# Patient Record
Sex: Female | Born: 1965 | Race: White | Hispanic: No | Marital: Married | Smoking: Never smoker
Health system: Southern US, Community
[De-identification: ages and names within clinical notes are randomized; demographics above are authoritative.]

## PROBLEM LIST (undated history)

## (undated) DIAGNOSIS — K5792 Diverticulitis of intestine, part unspecified, without perforation or abscess without bleeding: Secondary | ICD-10-CM

## (undated) DIAGNOSIS — G43909 Migraine, unspecified, not intractable, without status migrainosus: Secondary | ICD-10-CM

## (undated) HISTORY — PX: APPENDECTOMY: SHX54

## (undated) HISTORY — PX: BACK SURGERY: SHX140

## (undated) HISTORY — PX: LAPAROSCOPIC REVISION OF GASTRIC BAND: SHX5921

## (undated) HISTORY — PX: LAPAROSCOPIC GASTRIC BANDING: SHX1100

## (undated) HISTORY — PX: CHOLECYSTECTOMY: SHX55

---

## 1998-06-20 ENCOUNTER — Other Ambulatory Visit: Admission: RE | Admit: 1998-06-20 | Discharge: 1998-06-20 | Payer: Self-pay | Admitting: Obstetrics and Gynecology

## 1998-08-09 ENCOUNTER — Emergency Department (HOSPITAL_COMMUNITY): Admission: EM | Admit: 1998-08-09 | Discharge: 1998-08-09 | Payer: Self-pay | Admitting: Internal Medicine

## 1998-12-28 ENCOUNTER — Ambulatory Visit (HOSPITAL_COMMUNITY): Admission: RE | Admit: 1998-12-28 | Discharge: 1998-12-28 | Payer: Self-pay | Admitting: *Deleted

## 1998-12-28 ENCOUNTER — Encounter: Payer: Self-pay | Admitting: Family Medicine

## 1999-07-04 ENCOUNTER — Other Ambulatory Visit: Admission: RE | Admit: 1999-07-04 | Discharge: 1999-07-04 | Payer: Self-pay | Admitting: Obstetrics and Gynecology

## 2000-07-30 ENCOUNTER — Other Ambulatory Visit: Admission: RE | Admit: 2000-07-30 | Discharge: 2000-07-30 | Payer: Self-pay | Admitting: Obstetrics and Gynecology

## 2001-08-12 ENCOUNTER — Other Ambulatory Visit: Admission: RE | Admit: 2001-08-12 | Discharge: 2001-08-12 | Payer: Self-pay | Admitting: Obstetrics and Gynecology

## 2001-10-22 ENCOUNTER — Encounter: Payer: Self-pay | Admitting: Emergency Medicine

## 2001-10-22 ENCOUNTER — Emergency Department (HOSPITAL_COMMUNITY): Admission: EM | Admit: 2001-10-22 | Discharge: 2001-10-22 | Payer: Self-pay | Admitting: Emergency Medicine

## 2001-10-22 ENCOUNTER — Encounter: Payer: Self-pay | Admitting: *Deleted

## 2001-10-23 ENCOUNTER — Ambulatory Visit (HOSPITAL_COMMUNITY): Admission: RE | Admit: 2001-10-23 | Discharge: 2001-10-23 | Payer: Self-pay | Admitting: Emergency Medicine

## 2001-10-23 ENCOUNTER — Encounter: Payer: Self-pay | Admitting: Emergency Medicine

## 2002-08-30 ENCOUNTER — Other Ambulatory Visit: Admission: RE | Admit: 2002-08-30 | Discharge: 2002-08-30 | Payer: Self-pay | Admitting: Obstetrics and Gynecology

## 2003-09-01 ENCOUNTER — Other Ambulatory Visit: Admission: RE | Admit: 2003-09-01 | Discharge: 2003-09-01 | Payer: Self-pay | Admitting: Obstetrics and Gynecology

## 2004-05-15 ENCOUNTER — Ambulatory Visit (HOSPITAL_COMMUNITY): Admission: RE | Admit: 2004-05-15 | Discharge: 2004-05-15 | Payer: Self-pay | Admitting: Orthopaedic Surgery

## 2004-05-15 ENCOUNTER — Ambulatory Visit (HOSPITAL_BASED_OUTPATIENT_CLINIC_OR_DEPARTMENT_OTHER): Admission: RE | Admit: 2004-05-15 | Discharge: 2004-05-15 | Payer: Self-pay | Admitting: Orthopaedic Surgery

## 2004-09-10 ENCOUNTER — Ambulatory Visit (HOSPITAL_COMMUNITY): Admission: RE | Admit: 2004-09-10 | Discharge: 2004-09-10 | Payer: Self-pay | Admitting: *Deleted

## 2004-09-11 ENCOUNTER — Ambulatory Visit (HOSPITAL_COMMUNITY): Admission: RE | Admit: 2004-09-11 | Discharge: 2004-09-11 | Payer: Self-pay | Admitting: *Deleted

## 2004-09-12 ENCOUNTER — Other Ambulatory Visit: Admission: RE | Admit: 2004-09-12 | Discharge: 2004-09-12 | Payer: Self-pay | Admitting: Obstetrics and Gynecology

## 2004-09-25 ENCOUNTER — Encounter: Admission: RE | Admit: 2004-09-25 | Discharge: 2004-11-29 | Payer: Self-pay | Admitting: *Deleted

## 2004-11-30 ENCOUNTER — Observation Stay (HOSPITAL_COMMUNITY): Admission: RE | Admit: 2004-11-30 | Discharge: 2004-12-01 | Payer: Self-pay | Admitting: *Deleted

## 2004-12-13 ENCOUNTER — Encounter: Admission: RE | Admit: 2004-12-13 | Discharge: 2004-12-13 | Payer: Self-pay | Admitting: Surgery

## 2005-10-21 ENCOUNTER — Other Ambulatory Visit: Admission: RE | Admit: 2005-10-21 | Discharge: 2005-10-21 | Payer: Self-pay | Admitting: Obstetrics and Gynecology

## 2006-05-08 ENCOUNTER — Emergency Department (HOSPITAL_COMMUNITY): Admission: EM | Admit: 2006-05-08 | Discharge: 2006-05-08 | Payer: Self-pay | Admitting: Emergency Medicine

## 2008-01-19 ENCOUNTER — Ambulatory Visit (HOSPITAL_COMMUNITY): Admission: RE | Admit: 2008-01-19 | Discharge: 2008-01-19 | Payer: Self-pay | Admitting: Surgery

## 2008-02-09 ENCOUNTER — Emergency Department (HOSPITAL_COMMUNITY): Admission: EM | Admit: 2008-02-09 | Discharge: 2008-02-09 | Payer: Self-pay | Admitting: Emergency Medicine

## 2008-07-25 ENCOUNTER — Emergency Department (HOSPITAL_COMMUNITY): Admission: EM | Admit: 2008-07-25 | Discharge: 2008-07-25 | Payer: Self-pay | Admitting: Emergency Medicine

## 2008-10-21 ENCOUNTER — Encounter: Admission: RE | Admit: 2008-10-21 | Discharge: 2008-10-21 | Payer: Self-pay | Admitting: Gastroenterology

## 2009-04-07 ENCOUNTER — Encounter: Admission: RE | Admit: 2009-04-07 | Discharge: 2009-04-07 | Payer: Self-pay | Admitting: Obstetrics and Gynecology

## 2010-07-17 ENCOUNTER — Encounter (INDEPENDENT_AMBULATORY_CARE_PROVIDER_SITE_OTHER): Payer: Self-pay

## 2010-07-17 ENCOUNTER — Inpatient Hospital Stay (HOSPITAL_COMMUNITY): Admission: EM | Admit: 2010-07-17 | Discharge: 2010-07-20 | Payer: Self-pay

## 2010-07-17 ENCOUNTER — Encounter: Payer: Self-pay | Admitting: Emergency Medicine

## 2010-07-17 ENCOUNTER — Ambulatory Visit: Payer: Self-pay | Admitting: Diagnostic Radiology

## 2010-07-29 ENCOUNTER — Emergency Department (HOSPITAL_COMMUNITY): Admission: EM | Admit: 2010-07-29 | Discharge: 2010-07-29 | Payer: Self-pay | Admitting: Emergency Medicine

## 2010-12-30 ENCOUNTER — Encounter: Payer: Self-pay | Admitting: Obstetrics and Gynecology

## 2011-02-21 LAB — DIFFERENTIAL
Basophils Absolute: 0.1 10*3/uL (ref 0.0–0.1)
Basophils Relative: 0 % (ref 0–1)
Eosinophils Absolute: 0.1 10*3/uL (ref 0.0–0.7)
Eosinophils Relative: 1 % (ref 0–5)
Lymphocytes Relative: 9 % — ABNORMAL LOW (ref 12–46)
Lymphs Abs: 1.5 10*3/uL (ref 0.7–4.0)
Monocytes Absolute: 0.6 10*3/uL (ref 0.1–1.0)
Monocytes Relative: 3 % (ref 3–12)
Neutro Abs: 14.3 10*3/uL — ABNORMAL HIGH (ref 1.7–7.7)
Neutrophils Relative %: 87 % — ABNORMAL HIGH (ref 43–77)

## 2011-02-21 LAB — POCT I-STAT, CHEM 8
BUN: 12 mg/dL (ref 6–23)
Calcium, Ion: 1.15 mmol/L (ref 1.12–1.32)
Chloride: 107 mEq/L (ref 96–112)
Creatinine, Ser: 1.1 mg/dL (ref 0.4–1.2)
Glucose, Bld: 89 mg/dL (ref 70–99)
HCT: 43 % (ref 36.0–46.0)
Hemoglobin: 14.6 g/dL (ref 12.0–15.0)
Potassium: 4 mEq/L (ref 3.5–5.1)
Sodium: 139 mEq/L (ref 135–145)
TCO2: 23 mmol/L (ref 0–100)

## 2011-02-21 LAB — URINALYSIS, ROUTINE W REFLEX MICROSCOPIC
Bilirubin Urine: NEGATIVE
Glucose, UA: NEGATIVE mg/dL
Ketones, ur: NEGATIVE mg/dL
Leukocytes, UA: NEGATIVE
Nitrite: NEGATIVE
Protein, ur: 30 mg/dL — AB
Specific Gravity, Urine: 1.024 (ref 1.005–1.030)
Urobilinogen, UA: 1 mg/dL (ref 0.0–1.0)
pH: 8.5 — ABNORMAL HIGH (ref 5.0–8.0)

## 2011-02-21 LAB — CBC
HCT: 41 % (ref 36.0–46.0)
Hemoglobin: 13.4 g/dL (ref 12.0–15.0)
MCH: 29.8 pg (ref 26.0–34.0)
MCHC: 32.7 g/dL (ref 30.0–36.0)
MCV: 91.3 fL (ref 78.0–100.0)
Platelets: 482 10*3/uL — ABNORMAL HIGH (ref 150–400)
RBC: 4.49 MIL/uL (ref 3.87–5.11)
RDW: 14 % (ref 11.5–15.5)
WBC: 16.6 10*3/uL — ABNORMAL HIGH (ref 4.0–10.5)

## 2011-02-21 LAB — URINE MICROSCOPIC-ADD ON

## 2011-02-21 LAB — POCT PREGNANCY, URINE: Preg Test, Ur: NEGATIVE

## 2011-02-22 LAB — CBC
HCT: 38 % (ref 36.0–46.0)
Hemoglobin: 12.9 g/dL (ref 12.0–15.0)
MCH: 30.8 pg (ref 26.0–34.0)
MCHC: 33.9 g/dL (ref 30.0–36.0)
MCV: 90.9 fL (ref 78.0–100.0)
Platelets: 257 10*3/uL (ref 150–400)
RBC: 4.18 MIL/uL (ref 3.87–5.11)
RDW: 13.1 % (ref 11.5–15.5)
WBC: 11.8 10*3/uL — ABNORMAL HIGH (ref 4.0–10.5)

## 2011-02-22 LAB — COMPREHENSIVE METABOLIC PANEL
ALT: 14 U/L (ref 0–35)
AST: 22 U/L (ref 0–37)
Albumin: 3.9 g/dL (ref 3.5–5.2)
Alkaline Phosphatase: 75 U/L (ref 39–117)
BUN: 7 mg/dL (ref 6–23)
CO2: 24 mEq/L (ref 19–32)
Calcium: 9.2 mg/dL (ref 8.4–10.5)
Chloride: 107 mEq/L (ref 96–112)
Creatinine, Ser: 1 mg/dL (ref 0.4–1.2)
GFR calc Af Amer: >60 mL/min (ref >60)
GFR calc non Af Amer: >60 mL/min (ref >60)
Glucose, Bld: 100 mg/dL — ABNORMAL HIGH (ref 70–99)
Potassium: 4 mEq/L (ref 3.5–5.1)
Sodium: 141 mEq/L (ref 135–145)
Total Bilirubin: 0.9 mg/dL (ref 0.3–1.2)
Total Protein: 7.4 g/dL (ref 6.0–8.3)

## 2011-02-22 LAB — DIFFERENTIAL
Basophils Absolute: 0 10*3/uL (ref 0.0–0.1)
Basophils Relative: 0 % (ref 0–1)
Eosinophils Absolute: 0.1 10*3/uL (ref 0.0–0.7)
Eosinophils Relative: 1 % (ref 0–5)
Lymphocytes Relative: 12 % (ref 12–46)
Lymphs Abs: 1.4 10*3/uL (ref 0.7–4.0)
Monocytes Absolute: 0.7 10*3/uL (ref 0.1–1.0)
Monocytes Relative: 6 % (ref 3–12)
Neutro Abs: 9.6 10*3/uL — ABNORMAL HIGH (ref 1.7–7.7)
Neutrophils Relative %: 82 % — ABNORMAL HIGH (ref 43–77)

## 2011-02-22 LAB — URINE MICROSCOPIC-ADD ON

## 2011-02-22 LAB — URINALYSIS, ROUTINE W REFLEX MICROSCOPIC
Bilirubin Urine: NEGATIVE
Glucose, UA: NEGATIVE mg/dL
Hgb urine dipstick: NEGATIVE
Ketones, ur: NEGATIVE mg/dL
Nitrite: NEGATIVE
Protein, ur: NEGATIVE mg/dL
Specific Gravity, Urine: 1.021 (ref 1.005–1.030)
Urobilinogen, UA: 4 mg/dL — ABNORMAL HIGH (ref 0.0–1.0)
pH: 7.5 (ref 5.0–8.0)

## 2011-02-22 LAB — PREGNANCY, URINE: Preg Test, Ur: NEGATIVE

## 2011-04-23 NOTE — Op Note (Signed)
[name], Veronica Bailey                ACCOUNT NO.:  [id number]   MEDICAL RECORD NO.:  [id number]          PATIENT TYPE:  AMB   LOCATION:  DAY                          FACILITY:  [facility]   PHYSICIAN:  Thornton Park. Daphine Deutscher, MD  DATE OF BIRTH:  1966-11-17   DATE OF PROCEDURE:  01/19/2008  DATE OF DISCHARGE:  01/19/2008                               OPERATIVE REPORT   PREOPERATIVE DIAGNOSIS:  Leaking lap band port.   POSTOPERATIVE DIAGNOSIS:  Leakage from silicone membrane readily seen.   PROCEDURE:  Exchange of lap band port reimplantation.   SURGEON:  Thornton Park. Daphine Deutscher, MD   ANESTHESIA:  General.   DESCRIPTION OF PROCEDURE:  This is a 41-yearp-old lady who had a lap  band by Dr. Colin Benton.  She has recently noted that when she has the lap  filled, it does not seem like she stays filled very long.  I went it and  after prepping her, identifying the port site, excised her old scar,  went down and excised the pseudocapsule around this port.  I found the  sutures were all intact and I explanted it.  I then had some methylene  blue that I injected into the port and found it to be just profusely  leaking around the edge around the rim of the port site.  I went ahead  and put in a kit and reconnected it just distal to where the other one  was connected and then I filled her with 2 mL of sterile saline.  I then  sewed the new band port in and it will be most laterally maybe a little  inferior out of the lateral margin of the incision sewn in with four  sutures of 2-0 Prolene.  Wound was irrigated and closed 4-0 Vicryl,  Benzoin Steri-Strips.  The patient will be seen back in the office by  Dr. Colin Benton in about five weeks.      Thornton Park Daphine Deutscher, MD  Electronically Signed     MBM/MEDQ  D:  01/19/2008  T:  01/20/2008  Job:  [id number]

## 2011-04-26 NOTE — Op Note (Signed)
NAMEFRANCISCO, EYERLY                ACCOUNT NO.:  [id number]   MEDICAL RECORD NO.:  [id number]          PATIENT TYPE:  OBV   LOCATION:  0457                         FACILITY:  [facility]   PHYSICIAN:  Vikki Ports, MDDATE OF BIRTH:  1966-08-21   DATE OF PROCEDURE:  11/30/2004  DATE OF DISCHARGE:                                 OPERATIVE REPORT   PREOPERATIVE DIAGNOSIS:  Morbid obesity.   POSTOPERATIVE DIAGNOSIS:  Morbid obesity.   PROCEDURE:  Laparoscopic adjustable gastric banding with a 10 cm INAMED lap  band.   SURGEON:  Danna Hefty, MD   ASSISTANT:  Wenda Low, MD   ANESTHESIA:  General.   DESCRIPTION OF PROCEDURE:  The patient was taken to the operating room and  placed in supine position.  After adequate general anesthesia was induced  using endotracheal tube, the abdomen was prepped and draped in normal  sterile fashion.  Using an 11 mm trocar in the left upper quadrant and the  OptiView technique, pneumoperitoneum was obtained.  Additional 11 and 12 mm  trocars were placed in the right upper abdomen, and a 5 mm trocar was placed  in the left mid abdomen.  Nathanson liver retractor was placed, and the left  lateral segment of the liver was retracted anteriorly.  Angle of His was  sharply and bluntly dissected.  The gastrohepatic omentum was incised.  The  area on the right crus with crossing fat was identified, and the lap band  passer was passed posterior to the stomach and esophagus and angled up at  the angle of His.  A 10 cm lap band was then introduced into the abdomen and  passed by the lap band passer.  The INAMED sizing tube was placed down  intraorally and into the stomach.  The band was fastened in place and moved  easily.  The tube was removed, and three separate interrupted 2-0 Ethibond  sutures were placed, approximating seromuscular stomach to proximal stomach.  This gave good fixation of the band, and it was at least 1-2 cm from the  buckle.   Of note, there was a small hematoma developed on the proximal  stomach and esophagus secondary to needle trauma, but this was watched and  was not expanding.  Trocars were removed.  Pneumoperitoneum was released.  The port was fixed to the anterior rectus fascia.  Skin was closed with  subcutaneous 3-0 Vicryl and subcuticular 4-0 Monocryl.  Steri-Strips and  sterile dressings were applied.  The patient tolerated the procedure well  and went to PACU in good condition.     Gaylyn Rong   KRH/MEDQ  D:  11/30/2004  T:  11/30/2004  Job:  [id number]

## 2011-04-26 NOTE — Op Note (Signed)
NAMELADON, HENEY                          ACCOUNT NO.:  [id number]   MEDICAL RECORD NO.:  [id number]                   PATIENT TYPE:  AMB   LOCATION:  DSC                                  FACILITY:  [facility]   PHYSICIAN:  Lubertha Basque. Jerl Santos, M.D.             DATE OF BIRTH:  [birth year]   DATE OF PROCEDURE:  05/15/2004  DATE OF DISCHARGE:                                 OPERATIVE REPORT   PREOPERATIVE DIAGNOSIS:  Right knee neuroma.   POSTOPERATIVE DIAGNOSIS:  Right knee neuroma.   PROCEDURE:  Excision, neuroma, right knee.   ANESTHESIA:  General.   ATTENDING SURGEON:  Lubertha Basque. Jerl Santos, M.D.   INDICATION FOR PROCEDURE:  The patient is a 45 year old woman with a long  history of anterior knee pain.  She has difficulty kneeling due to intense  pain.  She has things on exam consistent with a neuroma with a palpable cord  subcutaneous near the tibial tubercle.  This has persisted despite physical  therapy for desensitization, use of Lidoderm patch, and an injection, which  did afford her transient relief.  She is offered excision.  An informed  operative consent was obtained after discussion of the possible  complications of and reaction to anesthesia, infection, and recurrence.   DESCRIPTION OF PROCEDURE:  The patient was taken to the operating suite,  where a general anesthetic was applied without difficulty.  She was  positioned supine and prepped and draped in the normal sterile fashion.  After the administration of preop IV antibiotics, a small transverse  incision was made in the area of her nodule.  The dissection was carried  down to a tubular structure in the subcutaneous tissue consistent with a  neuroma.  This was excised and placed in a specimen jar and given to the  patient.  This was directly overlying some of the peritenon fascia over the  patellar tendon.  A small portion of this was excised as well.  The tendon  underneath appeared completely healthy.  The wound was  irrigated, followed  by reapproximation of the skin with vertical mattresses of nylon.  Adaptic  was applied to the wound and some Marcaine was injected.  A dry gauze  dressing and a loose Ace wrap were applied.  Estimated blood loss and  intraoperative fluids can be obtained from anesthesia records.  The  tourniquet was never inflated.   DISPOSITION:  The patient was extubated in the operating room and taken to  the recovery room in stable condition.  Plans were for her to go home the  same day and follow up in the office in less than a week.  I will contact  her by phone tonight.  Lubertha Basque Jerl Santos, M.D.    PGD/MEDQ  D:  05/15/2004  T:  05/15/2004  Job:  [id number]

## 2011-08-30 LAB — BASIC METABOLIC PANEL
BUN: 7
CO2: 27
Calcium: 8.8
Chloride: 104
Creatinine, Ser: 0.82
GFR calc Af Amer: >60
GFR calc non Af Amer: >60
Glucose, Bld: 85
Potassium: 4.2
Sodium: 136

## 2011-08-30 LAB — HEMOGLOBIN AND HEMATOCRIT, BLOOD
HCT: 36.1
Hemoglobin: 12.3

## 2011-08-30 LAB — PREGNANCY, URINE: Preg Test, Ur: NEGATIVE

## 2012-11-10 ENCOUNTER — Other Ambulatory Visit: Payer: Self-pay | Admitting: Orthopedic Surgery

## 2012-11-10 DIAGNOSIS — M545 Low back pain: Secondary | ICD-10-CM

## 2012-11-11 ENCOUNTER — Other Ambulatory Visit: Payer: Self-pay

## 2013-03-19 ENCOUNTER — Encounter (HOSPITAL_COMMUNITY): Payer: Self-pay | Admitting: Emergency Medicine

## 2013-03-19 ENCOUNTER — Emergency Department (HOSPITAL_COMMUNITY): Payer: BC Managed Care – PPO

## 2013-03-19 ENCOUNTER — Emergency Department (HOSPITAL_COMMUNITY)
Admission: EM | Admit: 2013-03-19 | Discharge: 2013-03-20 | Disposition: A | Discharge status: Home / Self Care | Payer: BC Managed Care – PPO | Attending: Emergency Medicine | Admitting: Emergency Medicine

## 2013-03-19 DIAGNOSIS — G43909 Migraine, unspecified, not intractable, without status migrainosus: Secondary | ICD-10-CM | POA: Insufficient documentation

## 2013-03-19 DIAGNOSIS — Z79899 Other long term (current) drug therapy: Secondary | ICD-10-CM | POA: Insufficient documentation

## 2013-03-19 DIAGNOSIS — K5792 Diverticulitis of intestine, part unspecified, without perforation or abscess without bleeding: Secondary | ICD-10-CM

## 2013-03-19 DIAGNOSIS — K5732 Diverticulitis of large intestine without perforation or abscess without bleeding: Secondary | ICD-10-CM | POA: Insufficient documentation

## 2013-03-19 HISTORY — DX: Migraine, unspecified, not intractable, without status migrainosus: G43.909

## 2013-03-19 LAB — URINALYSIS, ROUTINE W REFLEX MICROSCOPIC
Bilirubin Urine: NEGATIVE
Glucose, UA: NEGATIVE mg/dL
Ketones, ur: NEGATIVE mg/dL
Nitrite: NEGATIVE
Protein, ur: NEGATIVE mg/dL
Specific Gravity, Urine: 1.009 (ref 1.005–1.030)
Urobilinogen, UA: 0.2 mg/dL (ref 0.0–1.0)
pH: 6 (ref 5.0–8.0)

## 2013-03-19 LAB — CBC WITH DIFFERENTIAL/PLATELET
Basophils Absolute: 0 10*3/uL (ref 0.0–0.1)
Basophils Relative: 0 % (ref 0–1)
Eosinophils Absolute: 0 10*3/uL (ref 0.0–0.7)
Eosinophils Relative: 0 % (ref 0–5)
HCT: 42.1 % (ref 36.0–46.0)
Hemoglobin: 14 g/dL (ref 12.0–15.0)
Lymphocytes Relative: 7 % — ABNORMAL LOW (ref 12–46)
Lymphs Abs: 1.7 10*3/uL (ref 0.7–4.0)
MCH: 29.9 pg (ref 26.0–34.0)
MCHC: 33.3 g/dL (ref 30.0–36.0)
MCV: 90 fL (ref 78.0–100.0)
Monocytes Absolute: 0.6 10*3/uL (ref 0.1–1.0)
Monocytes Relative: 3 % (ref 3–12)
Neutro Abs: 20.1 10*3/uL — ABNORMAL HIGH (ref 1.7–7.7)
Neutrophils Relative %: 90 % — ABNORMAL HIGH (ref 43–77)
Platelets: 311 10*3/uL (ref 150–400)
RBC: 4.68 MIL/uL (ref 3.87–5.11)
RDW: 13.6 % (ref 11.5–15.5)
WBC: 22.5 10*3/uL — ABNORMAL HIGH (ref 4.0–10.5)

## 2013-03-19 LAB — COMPREHENSIVE METABOLIC PANEL
ALT: 18 U/L (ref 0–35)
AST: 18 U/L (ref 0–37)
Albumin: 3.8 g/dL (ref 3.5–5.2)
Alkaline Phosphatase: 78 U/L (ref 39–117)
BUN: 9 mg/dL (ref 6–23)
CO2: 22 mEq/L (ref 19–32)
Calcium: 9.4 mg/dL (ref 8.4–10.5)
Chloride: 101 mEq/L (ref 96–112)
Creatinine, Ser: 1.07 mg/dL (ref 0.50–1.10)
GFR calc Af Amer: 70 mL/min — ABNORMAL LOW (ref >90)
GFR calc non Af Amer: 61 mL/min — ABNORMAL LOW (ref >90)
Glucose, Bld: 110 mg/dL — ABNORMAL HIGH (ref 70–99)
Potassium: 3.2 mEq/L — ABNORMAL LOW (ref 3.5–5.1)
Sodium: 136 mEq/L (ref 135–145)
Total Bilirubin: 0.8 mg/dL (ref 0.3–1.2)
Total Protein: 7.6 g/dL (ref 6.0–8.3)

## 2013-03-19 LAB — URINE MICROSCOPIC-ADD ON

## 2013-03-19 LAB — LIPASE, BLOOD: Lipase: 17 U/L (ref 11–59)

## 2013-03-19 MED ORDER — IOHEXOL 300 MG/ML  SOLN: 50.0000 mL | Freq: Once | INTRAMUSCULAR | Status: AC | PRN

## 2013-03-19 MED ORDER — IOHEXOL 300 MG/ML  SOLN
100.0000 mL | Freq: Once | INTRAMUSCULAR | Status: AC | PRN
  Administered 2013-03-19: 100 mL via INTRAVENOUS

## 2013-03-19 MED ORDER — ONDANSETRON HCL 4 MG/2ML IJ SOLN
4.0000 mg | Freq: Once | INTRAMUSCULAR | Status: AC
  Administered 2013-03-19: 4 mg via INTRAVENOUS
  Filled 2013-03-19: qty 2

## 2013-03-19 MED ORDER — HYDROMORPHONE HCL PF 1 MG/ML IJ SOLN
1.0000 mg | Freq: Once | INTRAMUSCULAR | Status: AC
  Administered 2013-03-19: 1 mg via INTRAVENOUS
  Filled 2013-03-19: qty 1

## 2013-03-19 MED ORDER — SODIUM CHLORIDE 0.9 % IV BOLUS (SEPSIS)
1000.0000 mL | Freq: Once | INTRAVENOUS | Status: AC
  Administered 2013-03-19: 1000 mL via INTRAVENOUS

## 2013-03-19 NOTE — ED Notes (Signed)
Pt c/o severe mid abd pain onset yesterday, diarrhea x 7. Pt seen by after hours clinic sent to ED for further evaluation with CT scan WBC 20.9

## 2013-03-20 LAB — CG4 I-STAT (LACTIC ACID): Lactic Acid, Venous: 0.76 mmol/L (ref 0.5–2.2)

## 2013-03-20 MED ORDER — HYDROCODONE-ACETAMINOPHEN 5-325 MG PO TABS: 1.0000 | ORAL_TABLET | ORAL | Status: DC | PRN

## 2013-03-20 MED ORDER — CIPROFLOXACIN HCL 500 MG PO TABS: 500.0000 mg | ORAL_TABLET | Freq: Two times a day (BID) | ORAL | Status: DC

## 2013-03-20 MED ORDER — METRONIDAZOLE 500 MG PO TABS: 500.0000 mg | ORAL_TABLET | Freq: Two times a day (BID) | ORAL | Status: DC

## 2013-03-20 MED ORDER — METRONIDAZOLE IN NACL 5-0.79 MG/ML-% IV SOLN
500.0000 mg | Freq: Once | INTRAVENOUS | Status: AC
  Administered 2013-03-20: 500 mg via INTRAVENOUS
  Filled 2013-03-20: qty 100

## 2013-03-20 MED ORDER — HYDROMORPHONE HCL PF 1 MG/ML IJ SOLN
1.0000 mg | Freq: Once | INTRAMUSCULAR | Status: AC
  Administered 2013-03-20: 1 mg via INTRAVENOUS
  Filled 2013-03-20: qty 1

## 2013-03-20 MED ORDER — CIPROFLOXACIN IN D5W 400 MG/200ML IV SOLN
400.0000 mg | Freq: Once | INTRAVENOUS | Status: AC
  Administered 2013-03-20: 400 mg via INTRAVENOUS
  Filled 2013-03-20: qty 200

## 2013-03-20 NOTE — ED Provider Notes (Signed)
Veronica Bailey S 1:30 AM patient discussed in sign out with [facility] lawyer PAC. Patient found to have signs of diverticulitis. Will receive doses of Flagyl and Cipro in the emergency room. Plan for outpatient treatment initially. Patient understands diagnosis and plan. Once patient has received medications may be discharged.   2:40 AM patient received antibiotic medications. Continues to do well and is able to be discharge at this time.  Angus Seller, PA-C 03/20/13 [phone]

## 2013-03-20 NOTE — ED Provider Notes (Signed)
History     CSN: [id number]  Arrival date & time 03/19/13  2151   First MD Initiated Contact with Patient 03/19/13 2208      Chief Complaint  Patient presents with  . Abdominal Pain    (Consider location/radiation/quality/duration/timing/severity/associated sxs/prior treatment) HPI Patient presents to the emergency department with abdominal pain, that began one day ago.  Patient, states she started having discomfort several weeks ago, but got worse over the last day.  Patient, states she's not had any diarrhea, vomiting, headache, blurred vision, fever, weakness, chest pain, shortness of breath, dizziness, or syncope.  Patient, states, that he was seen by her primary care doctor earlier and was sent here for further evaluation.  Patient, states, that palpation makes her pain, worse Past Medical History  Diagnosis Date  . Migraine     Past Surgical History  Procedure Laterality Date  . Back surgery    . Appendectomy    . Cholecystectomy    . Laparoscopic gastric banding    . Laparoscopic revision of gastric band      History reviewed. No pertinent family history.  History  Substance Use Topics  . Smoking status: Never Smoker   . Smokeless tobacco: Not on file  . Alcohol Use: No    OB History   Grav Para Term Preterm Abortions TAB SAB Ect Mult Living                  Review of Systems All other systems negative except as documented in the HPI. All pertinent positives and negatives as reviewed in the HPI.  Allergies  Benadryl and Imitrex  Home Medications   Current Outpatient Rx  Name  Route  Sig  Dispense  Refill  . buPROPion (WELLBUTRIN XL) 300 MG 24 hr tablet   Oral   Take 300 mg by mouth daily.         . hyoscyamine (LEVSIN, ANASPAZ) 0.125 MG tablet   Oral   Take 0.125 mg by mouth every 4 (four) hours as needed for cramping.         . Levonorgestrel-Ethinyl Estradiol (SEASONIQUE) 0.15-0.03 &0.01 MG tablet   Oral   Take 1 tablet by mouth daily.        . promethazine (PHENERGAN) 25 MG tablet   Oral   Take 25 mg by mouth every 6 (six) hours as needed for nausea.         . rizatriptan (MAXALT) 10 MG tablet   Oral   Take 10 mg by mouth daily as needed for migraine. May repeat in 2 hours if needed         . zolpidem (AMBIEN) 10 MG tablet   Oral   Take 5 mg by mouth at bedtime as needed for sleep.           BP 149/88  Pulse 106  Temp(Src) 98.3 F (36.8 C) (Oral)  Resp 24  SpO2 100%  LMP 01/19/2013  Physical Exam  Nursing note and vitals reviewed. Constitutional: She is oriented to person, place, and time. She appears well-developed and well-nourished. No distress.  HENT:  Head: Normocephalic and atraumatic.  Mouth/Throat: Oropharynx is clear and moist. No oropharyngeal exudate.  Eyes: Pupils are equal, round, and reactive to light.  Neck: Normal range of motion. Neck supple.  Cardiovascular: Normal rate, regular rhythm and normal heart sounds.  Exam reveals no gallop and no friction rub.   No murmur heard. Pulmonary/Chest: Effort normal and breath sounds normal. No respiratory distress.  Abdominal: Soft. Normal appearance and bowel sounds are normal. She exhibits no distension, no fluid wave, no ascites and no mass. There is tenderness. There is no rigidity, no rebound, no guarding and no CVA tenderness. No hernia.    Neurological: She is alert and oriented to person, place, and time.  Skin: Skin is warm and dry. No rash noted.    ED Course  Procedures (including critical care time)  Labs Reviewed  CBC WITH DIFFERENTIAL - Abnormal; Notable for the following:    WBC 22.5 (*)    Neutrophils Relative 90 (*)    Neutro Abs 20.1 (*)    Lymphocytes Relative 7 (*)    All other components within normal limits  URINALYSIS, ROUTINE W REFLEX MICROSCOPIC - Abnormal; Notable for the following:    APPearance CLOUDY (*)    Hgb urine dipstick LARGE (*)    Leukocytes, UA SMALL (*)    All other components within normal  limits  COMPREHENSIVE METABOLIC PANEL - Abnormal; Notable for the following:    Potassium 3.2 (*)    Glucose, Bld 110 (*)    GFR calc non Af Amer 61 (*)    GFR calc Af Amer 70 (*)    All other components within normal limits  URINE MICROSCOPIC-ADD ON - Abnormal; Notable for the following:    Squamous Epithelial / LPF FEW (*)    All other components within normal limits  LIPASE, BLOOD  CG4 I-STAT (LACTIC ACID)   Ct Abdomen Pelvis W Contrast  03/20/2013  *RADIOLOGY REPORT*  Clinical Data: Abdominal pain.  Diarrhea.  Leukocytosis.  CT ABDOMEN AND PELVIS WITH CONTRAST  Technique:  Multidetector CT imaging of the abdomen and pelvis was performed following the standard protocol during bolus administration of intravenous contrast.  Contrast: OMNIPAQUE IOHEXOL 300 MG/ML  SOLN  Comparison: 07/29/2010  Findings: Gastric lap band again seen in appropriate position, and prior cholecystectomy again noted.  No evidence of biliary ductal dilatation.  The liver, spleen, pancreas, and adrenal glands are normal in appearance. A few tiny less than 5 mm nonobstructing left renal calculi are seen, and a tiny cyst is seen in the lower pole of the right kidney.  No evidence of ureteral calculi or hydronephrosis.  No soft tissue masses or lymphadenopathy identified.  Wall thickening and mild pericolonic inflammatory changes are seen involving the sigmoid colon in an area of diverticulosis.  This is consistent with acute diverticulitis.  There is no evidence of abscess or free fluid.  Uterus and adnexa are otherwise unremarkable in appearance.  IMPRESSION:  1.  Mild sigmoid diverticulitis. 2.  No evidence of abscess or other complication. 3.  Left nephrolithiasis.  No evidence of ureteral calculi or hydronephrosis.   Original Report Authenticated By: Myles Rosenthal, M.D.      Patient has what appears to be mild sigmoid diverticulitis.  Patient be referred back to her primary care Dr. she is tolerating fluids.   MDM           Carlyle Dolly, PA-C 03/20/13 [phone]

## 2013-03-20 NOTE — ED Provider Notes (Signed)
Medical screening examination/treatment/procedure(s) were performed by non-physician practitioner and as supervising physician I was immediately available for consultation/collaboration.   Verlaine Embry L Jaquetta Currier, MD 03/20/13 0611 

## 2013-03-20 NOTE — ED Provider Notes (Signed)
Medical screening examination/treatment/procedure(s) were performed by non-physician practitioner and as supervising physician I was immediately available for consultation/collaboration.   Hanley Seamen, MD 03/20/13 [phone]

## 2014-08-23 ENCOUNTER — Other Ambulatory Visit: Payer: Self-pay | Admitting: Obstetrics and Gynecology

## 2014-08-24 LAB — CYTOLOGY - PAP

## 2016-04-02 DIAGNOSIS — Z1231 Encounter for screening mammogram for malignant neoplasm of breast: Secondary | ICD-10-CM | POA: Diagnosis not present

## 2016-10-04 DIAGNOSIS — K5792 Diverticulitis of intestine, part unspecified, without perforation or abscess without bleeding: Secondary | ICD-10-CM | POA: Diagnosis not present

## 2016-10-09 ENCOUNTER — Emergency Department (HOSPITAL_COMMUNITY): Payer: BLUE CROSS/BLUE SHIELD

## 2016-10-09 ENCOUNTER — Emergency Department (HOSPITAL_COMMUNITY)
Admission: EM | Admit: 2016-10-09 | Discharge: 2016-10-10 | Disposition: A | Discharge status: Home / Self Care | Payer: BLUE CROSS/BLUE SHIELD | Attending: Emergency Medicine | Admitting: Emergency Medicine

## 2016-10-09 ENCOUNTER — Encounter (HOSPITAL_COMMUNITY): Payer: Self-pay | Admitting: Emergency Medicine

## 2016-10-09 DIAGNOSIS — K5732 Diverticulitis of large intestine without perforation or abscess without bleeding: Secondary | ICD-10-CM | POA: Diagnosis not present

## 2016-10-09 DIAGNOSIS — R1030 Lower abdominal pain, unspecified: Secondary | ICD-10-CM | POA: Diagnosis present

## 2016-10-09 DIAGNOSIS — Z79899 Other long term (current) drug therapy: Secondary | ICD-10-CM | POA: Insufficient documentation

## 2016-10-09 LAB — CBC
HEMATOCRIT: 41.6 % (ref 36.0–46.0)
Hemoglobin: 13.6 g/dL (ref 12.0–15.0)
MCH: 30 pg (ref 26.0–34.0)
MCHC: 32.7 g/dL (ref 30.0–36.0)
MCV: 91.6 fL (ref 78.0–100.0)
Platelets: 284 10*3/uL (ref 150–400)
RBC: 4.54 MIL/uL (ref 3.87–5.11)
RDW: 13.5 % (ref 11.5–15.5)
WBC: 11 10*3/uL — ABNORMAL HIGH (ref 4.0–10.5)

## 2016-10-09 LAB — URINALYSIS, ROUTINE W REFLEX MICROSCOPIC
BILIRUBIN URINE: NEGATIVE
GLUCOSE, UA: NEGATIVE mg/dL
Hgb urine dipstick: NEGATIVE
KETONES UR: NEGATIVE mg/dL
LEUKOCYTES UA: NEGATIVE
Nitrite: NEGATIVE
PH: 6.5 (ref 5.0–8.0)
Protein, ur: NEGATIVE mg/dL
SPECIFIC GRAVITY, URINE: 1.005 (ref 1.005–1.030)

## 2016-10-09 LAB — COMPREHENSIVE METABOLIC PANEL
ALBUMIN: 4.4 g/dL (ref 3.5–5.0)
ALT: 16 U/L (ref 14–54)
AST: 17 U/L (ref 15–41)
Alkaline Phosphatase: 88 U/L (ref 38–126)
Anion gap: 7 (ref 5–15)
BUN: 10 mg/dL (ref 6–20)
CHLORIDE: 106 mmol/L (ref 101–111)
CO2: 25 mmol/L (ref 22–32)
Calcium: 9.6 mg/dL (ref 8.9–10.3)
Creatinine, Ser: 1.14 mg/dL — ABNORMAL HIGH (ref 0.44–1.00)
GFR calc Af Amer: >60 mL/min (ref >60)
GFR calc non Af Amer: 55 mL/min — ABNORMAL LOW (ref >60)
GLUCOSE: 92 mg/dL (ref 65–99)
POTASSIUM: 4.1 mmol/L (ref 3.5–5.1)
SODIUM: 138 mmol/L (ref 135–145)
Total Bilirubin: 0.7 mg/dL (ref 0.3–1.2)
Total Protein: 7.5 g/dL (ref 6.5–8.1)

## 2016-10-09 LAB — LIPASE, BLOOD: LIPASE: 27 U/L (ref 11–51)

## 2016-10-09 LAB — POC URINE PREG, ED: Preg Test, Ur: NEGATIVE

## 2016-10-09 MED ORDER — ONDANSETRON 4 MG PO TBDP
4.0000 mg | ORAL_TABLET | Freq: Three times a day (TID) | ORAL | 0 refills | Status: AC | PRN
Start: 2016-10-09 — End: ?

## 2016-10-09 MED ORDER — IOPAMIDOL (ISOVUE-300) INJECTION 61%
100.0000 mL | Freq: Once | INTRAVENOUS | Status: AC | PRN
  Administered 2016-10-09: 100 mL via INTRAVENOUS

## 2016-10-09 MED ORDER — ONDANSETRON HCL 4 MG/2ML IJ SOLN
4.0000 mg | Freq: Once | INTRAMUSCULAR | Status: AC
  Administered 2016-10-09: 4 mg via INTRAVENOUS

## 2016-10-09 MED ORDER — HYDROCODONE-ACETAMINOPHEN 5-325 MG PO TABS: 1.0000 | ORAL_TABLET | ORAL | 0 refills | Status: DC | PRN

## 2016-10-09 MED ORDER — ONDANSETRON 4 MG PO TBDP
4.0000 mg | ORAL_TABLET | Freq: Once | ORAL | Status: AC | PRN
  Administered 2016-10-09: 4 mg via ORAL
  Filled 2016-10-09: qty 1

## 2016-10-09 MED ORDER — CLINDAMYCIN HCL 300 MG PO CAPS: 300.0000 mg | ORAL_CAPSULE | Freq: Four times a day (QID) | ORAL | 0 refills | Status: AC

## 2016-10-09 MED ORDER — ONDANSETRON HCL 4 MG/2ML IJ SOLN
4.0000 mg | Freq: Once | INTRAMUSCULAR | Status: DC
  Filled 2016-10-09 (×2): qty 2

## 2016-10-09 MED ORDER — MORPHINE SULFATE (PF) 2 MG/ML IV SOLN
4.0000 mg | INTRAVENOUS | Status: DC | PRN
  Filled 2016-10-09: qty 2

## 2016-10-09 MED ORDER — HYDROCODONE-ACETAMINOPHEN 5-325 MG PO TABS
1.0000 | ORAL_TABLET | Freq: Once | ORAL | Status: AC
  Administered 2016-10-09: 1 via ORAL
  Filled 2016-10-09: qty 1

## 2016-10-09 MED ORDER — CLINDAMYCIN PHOSPHATE 900 MG/50ML IV SOLN
900.0000 mg | Freq: Once | INTRAVENOUS | Status: AC
  Administered 2016-10-09: 900 mg via INTRAVENOUS
  Filled 2016-10-09: qty 50

## 2016-10-09 NOTE — Discharge Instructions (Signed)
Push fluids, stay hydrated. MiraLAX as needed if 24 hours without stool. Low fiber diet until improving. Continue high-fiber diet after recovery Stop Cipro. Clindamycin 3 times a day 7 days. Vicodin for pain. Zofran as needed for nausea Return to ER at any time with worsening symptoms including pain, nausea, fever, inability to tolerate symptoms, or medications.

## 2016-10-09 NOTE — ED Triage Notes (Signed)
Patient reports she was seen at her PCP Friday for lower abdominal pain x 2 weeks. The pain became more severe today. Patient was prescribed Cipro for possible diverticulitis, which the patient is still taking. Denies vomiting and diarrhea. Reports nausea.

## 2016-10-09 NOTE — ED Provider Notes (Signed)
[facility]-EMERGENCY DEPT Provider Note   CSN: [id number] Arrival date & time: 10/09/16  1645     History   Chief Complaint Chief Complaint  Patient presents with  . Abdominal Pain    HPI Veronica Bailey is a 50 y.o. female. She presents for abdominal pain and a concern for diverticulitis.  History of multiple previous episodes of diverticulitis. His never been hospitalized for constipation. His always been treated outpatient. She describes abdominal pain starting last Friday, 5 days ago. Seen that day by her primary care physician and placed on Cipro. States she has taken Cipro, and/or Flagyl in the past. She does not tolerate them" at the same time". Gives her severe nausea. Thus only Cipro for 5 days. Not improving. It is slightly worsening. Suprapubic abdominal pain. Nothing blood but states she has "thick mucus" in her stool. Feels as though she needs to have a bowel movement but cannot.  HPI  Past Medical History:  Diagnosis Date  . Migraine     There are no active problems to display for this patient.   Past Surgical History:  Procedure Laterality Date  . APPENDECTOMY    . BACK SURGERY    . CHOLECYSTECTOMY    . LAPAROSCOPIC GASTRIC BANDING    . LAPAROSCOPIC REVISION OF GASTRIC BAND      OB History    No data available       Home Medications    Prior to Admission medications   Medication Sig Start Date End Date Taking? Authorizing Provider  buPROPion (WELLBUTRIN XL) 300 MG 24 hr tablet Take 300 mg by mouth daily.   Yes Historical Provider, MD  ciprofloxacin (CIPRO) 500 MG tablet Take 1 tablet (500 mg total) by mouth 2 (two) times daily. One po bid x 7 days Patient taking differently: Take 500 mg by mouth 2 (two) times daily.  03/20/13  Yes Peter Dammen, PA-C  FIBER PO Take 1 tablet by mouth at bedtime.   Yes Historical Provider, MD  levonorgestrel (MIRENA) 20 MCG/24HR IUD 1 each by Intrauterine route once.   Yes Historical Provider, MD  magnesium gluconate  (MAGONATE) 500 MG tablet Take 500 mg by mouth every evening.   Yes Historical Provider, MD  rizatriptan (MAXALT) 10 MG tablet Take 10 mg by mouth daily as needed for migraine. May repeat in 2 hours if needed   Yes Historical Provider, MD  zolpidem (AMBIEN) 10 MG tablet Take 5 mg by mouth at bedtime.    Yes Historical Provider, MD  clindamycin (CLEOCIN) 300 MG capsule Take 1 capsule (300 mg total) by mouth every 6 (six) hours. 10/09/16   Rolland PorterMark Tymarion Everard, MD  HYDROcodone-acetaminophen (NORCO/VICODIN) 5-325 MG tablet Take 1 tablet by mouth every 4 (four) hours as needed. 10/09/16   Rolland PorterMark Aarush Stukey, MD  ondansetron (ZOFRAN ODT) 4 MG disintegrating tablet Take 1 tablet (4 mg total) by mouth every 8 (eight) hours as needed for nausea. 10/09/16   Rolland PorterMark Tianne Plott, MD    Family History No family history on file.  Social History Social History  Substance Use Topics  . Smoking status: Never Smoker  . Smokeless tobacco: Never Used  . Alcohol use No     Allergies   Benadryl [diphenhydramine hcl] and Imitrex [sumatriptan]   Review of Systems Review of Systems  Constitutional: Negative for appetite change, chills, diaphoresis, fatigue and fever.  HENT: Negative for mouth sores, sore throat and trouble swallowing.   Eyes: Negative for visual disturbance.  Respiratory: Negative for cough, chest tightness,  shortness of breath and wheezing.   Cardiovascular: Negative for chest pain.  Gastrointestinal: Positive for abdominal distention and nausea. Negative for abdominal pain, diarrhea and vomiting.  Endocrine: Negative for polydipsia, polyphagia and polyuria.  Genitourinary: Negative for dysuria, frequency and hematuria.  Musculoskeletal: Negative for gait problem.  Skin: Negative for color change, pallor and rash.  Neurological: Negative for dizziness, syncope, light-headedness and headaches.  Hematological: Does not bruise/bleed easily.  Psychiatric/Behavioral: Negative for behavioral problems and confusion.      Physical Exam Updated Vital Signs BP 138/98 (BP Location: Right Arm)   Pulse 76   Temp 98 F (36.7 C) (Oral)   Resp 18   Ht 5\' 4"  (1.626 m)   Wt 159 lb 9.6 oz (72.4 kg)   SpO2 95%   BMI 27.40 kg/m   Physical Exam  Constitutional: She is oriented to person, place, and time. She appears well-developed and well-nourished. No distress.  HENT:  Head: Normocephalic.  Eyes: Conjunctivae are normal. Pupils are equal, round, and reactive to light. No scleral icterus.  Neck: Normal range of motion. Neck supple. No thyromegaly present.  Cardiovascular: Normal rate and regular rhythm.  Exam reveals no gallop and no friction rub.   No murmur heard. Pulmonary/Chest: Effort normal and breath sounds normal. No respiratory distress. She has no wheezes. She has no rales.  Abdominal: Soft. Bowel sounds are normal. She exhibits no distension. There is no tenderness. There is no rebound.    Musculoskeletal: Normal range of motion.  Neurological: She is alert and oriented to person, place, and time.  Skin: Skin is warm and dry. No rash noted.  Psychiatric: She has a normal mood and affect. Her behavior is normal.     ED Treatments / Results  Labs (all labs ordered are listed, but only abnormal results are displayed) Labs Reviewed  COMPREHENSIVE METABOLIC PANEL - Abnormal; Notable for the following:       Result Value   Creatinine, Ser 1.14 (*)    GFR calc non Af Amer 55 (*)    All other components within normal limits  CBC - Abnormal; Notable for the following:    WBC 11.0 (*)    All other components within normal limits  LIPASE, BLOOD  URINALYSIS, ROUTINE W REFLEX MICROSCOPIC (NOT AT [facility])  POC URINE PREG, ED    EKG  EKG Interpretation None       Radiology Ct Abdomen Pelvis W Contrast  Result Date: 10/09/2016 CLINICAL DATA:  Suprapubic pain.  Question diverticulitis. EXAM: CT ABDOMEN AND PELVIS WITH CONTRAST TECHNIQUE: Multidetector CT imaging of the abdomen and pelvis  was performed using the standard protocol following bolus administration of intravenous contrast. CONTRAST:  ISOVUE-300 IOPAMIDOL (ISOVUE-300) INJECTION 61% COMPARISON:  CT abdomen pelvis 03/19/2013 FINDINGS: Lower chest: No acute findings. Hepatobiliary: No focal liver abnormality is seen. Status post cholecystectomy. No biliary dilatation. Pancreas: Unremarkable. No pancreatic ductal dilatation or surrounding inflammatory changes. Spleen: Normal in size without focal abnormality. Adrenals/Urinary Tract: Normal adrenal glands. Two 2-3 mm stones in the upper pole the left kidney, nonobstructing. Otherwise, left kidney normal. Right kidney within normal limits. The ureters normal in caliber bilaterally. Normal urinary bladder. Stomach/Bowel: Gastric lap band in stable and satisfactory position, with port in the subcutaneous tissues of the right anterior abdominal wall. The tubing is intact. Stomach within normal limits. Small bowel loops normal in caliber and wall thickness. There is extensive diverticulosis involving the sigmoid colon. There is stranding in the adjacent sigmoid mesenteric in the  region of the diverticulosis. No extraluminal gas or abscess is identified. Negative for free intraperitoneal air. There are scattered uncomplicated diverticula involving the descending colon. No significant/definite free fluid in the pelvis. Vascular/Lymphatic: There a few non pathologically enlarged retroperitoneal lymph nodes. No lymphadenopathy. Abdominal aorta normal in caliber and appearance. Iliac and proximal femoral arteries normal. Reproductive: Intrauterine device appears appropriately positioned. Uterus within normal limits for CT appearances. No adnexal mass identified. Other: No abdominal wall hernia or abnormality. No abdominopelvic ascites. Musculoskeletal: Degenerative disc disease L5-S1. IMPRESSION: Acute sigmoid colon diverticulitis. Negative for abscess or free air. Two small nonobstructing left  renal stones. Gastric lap band in stable and satisfactory position. Electronically Signed   By: Britta MccreedySusan  Turner M.D.   On: 10/09/2016 20:31    Procedures Procedures (including critical care time)  Medications Ordered in ED Medications  ondansetron (ZOFRAN) injection 4 mg (4 mg Intravenous Refused 10/09/16 1917)  morphine 2 MG/ML injection 4 mg (4 mg Intravenous Refused 10/09/16 1917)  clindamycin (CLEOCIN) IVPB 900 mg (not administered)  ondansetron (ZOFRAN-ODT) disintegrating tablet 4 mg (4 mg Oral Given 10/09/16 1712)  iopamidol (ISOVUE-300) 61 % injection 100 mL (100 mLs Intravenous Contrast Given 10/09/16 1954)     Initial Impression / Assessment and Plan / ED Course  I have reviewed the triage vital signs and the nursing notes.  Pertinent labs & imaging results that were available during my care of the patient were reviewed by me and considered in my medical decision making (see chart for details).  Clinical Course    Differential diagnosis would Include colitis, diverticulitis. Patient is status post cholecystectomy and appendectomy.   IV fluids, pain control, antiemetics, CT scan. We'll reassess after the above.  Final Clinical Impressions(s) / ED Diagnoses   Final diagnoses:  Diverticulitis of large intestine without perforation or abscess without bleeding    CT shows normal position of her gastric sleeve. 2 renal stones without hydronephrosis or passage. No ureteral stones. Show sigmoid diverticulitis  without abscess or perforation.  New Prescriptions New Prescriptions   CLINDAMYCIN (CLEOCIN) 300 MG CAPSULE    Take 1 capsule (300 mg total) by mouth every 6 (six) hours.   HYDROCODONE-ACETAMINOPHEN (NORCO/VICODIN) 5-325 MG TABLET    Take 1 tablet by mouth every 4 (four) hours as needed.   ONDANSETRON (ZOFRAN ODT) 4 MG DISINTEGRATING TABLET    Take 1 tablet (4 mg total) by mouth every 8 (eight) hours as needed for nausea.     Rolland PorterMark Pariss Hommes, MD 10/09/16 2134

## 2016-10-15 DIAGNOSIS — Z01419 Encounter for gynecological examination (general) (routine) without abnormal findings: Secondary | ICD-10-CM | POA: Diagnosis not present

## 2016-10-15 DIAGNOSIS — Z6830 Body mass index (BMI) 30.0-30.9, adult: Secondary | ICD-10-CM | POA: Diagnosis not present

## 2016-11-06 DIAGNOSIS — M1712 Unilateral primary osteoarthritis, left knee: Secondary | ICD-10-CM | POA: Diagnosis not present

## 2016-11-06 DIAGNOSIS — M25562 Pain in left knee: Secondary | ICD-10-CM | POA: Diagnosis not present

## 2016-11-22 DIAGNOSIS — K5730 Diverticulosis of large intestine without perforation or abscess without bleeding: Secondary | ICD-10-CM | POA: Diagnosis not present

## 2016-11-28 DIAGNOSIS — N200 Calculus of kidney: Secondary | ICD-10-CM | POA: Diagnosis not present

## 2016-12-05 DIAGNOSIS — N200 Calculus of kidney: Secondary | ICD-10-CM | POA: Diagnosis not present

## 2017-01-29 DIAGNOSIS — M5442 Lumbago with sciatica, left side: Secondary | ICD-10-CM | POA: Diagnosis not present

## 2017-01-29 DIAGNOSIS — Z6829 Body mass index (BMI) 29.0-29.9, adult: Secondary | ICD-10-CM | POA: Diagnosis not present

## 2017-01-29 DIAGNOSIS — R030 Elevated blood-pressure reading, without diagnosis of hypertension: Secondary | ICD-10-CM | POA: Diagnosis not present

## 2017-01-29 DIAGNOSIS — G8929 Other chronic pain: Secondary | ICD-10-CM | POA: Diagnosis not present

## 2017-02-05 DIAGNOSIS — M5442 Lumbago with sciatica, left side: Secondary | ICD-10-CM | POA: Diagnosis not present

## 2017-02-05 DIAGNOSIS — M48061 Spinal stenosis, lumbar region without neurogenic claudication: Secondary | ICD-10-CM | POA: Diagnosis not present

## 2017-03-10 DIAGNOSIS — M5127 Other intervertebral disc displacement, lumbosacral region: Secondary | ICD-10-CM | POA: Diagnosis not present

## 2017-03-10 DIAGNOSIS — M5117 Intervertebral disc disorders with radiculopathy, lumbosacral region: Secondary | ICD-10-CM | POA: Diagnosis not present

## 2017-03-24 ENCOUNTER — Emergency Department (HOSPITAL_BASED_OUTPATIENT_CLINIC_OR_DEPARTMENT_OTHER)
Admission: EM | Admit: 2017-03-24 | Discharge: 2017-03-24 | Disposition: A | Discharge status: Home / Self Care | Payer: BLUE CROSS/BLUE SHIELD | Attending: Emergency Medicine | Admitting: Emergency Medicine

## 2017-03-24 ENCOUNTER — Emergency Department (HOSPITAL_BASED_OUTPATIENT_CLINIC_OR_DEPARTMENT_OTHER): Payer: BLUE CROSS/BLUE SHIELD

## 2017-03-24 ENCOUNTER — Encounter (HOSPITAL_BASED_OUTPATIENT_CLINIC_OR_DEPARTMENT_OTHER): Payer: Self-pay | Admitting: *Deleted

## 2017-03-24 DIAGNOSIS — Z79899 Other long term (current) drug therapy: Secondary | ICD-10-CM | POA: Diagnosis not present

## 2017-03-24 DIAGNOSIS — K5792 Diverticulitis of intestine, part unspecified, without perforation or abscess without bleeding: Secondary | ICD-10-CM | POA: Insufficient documentation

## 2017-03-24 DIAGNOSIS — R1032 Left lower quadrant pain: Secondary | ICD-10-CM | POA: Diagnosis present

## 2017-03-24 DIAGNOSIS — K5732 Diverticulitis of large intestine without perforation or abscess without bleeding: Secondary | ICD-10-CM | POA: Diagnosis not present

## 2017-03-24 DIAGNOSIS — R109 Unspecified abdominal pain: Secondary | ICD-10-CM | POA: Diagnosis not present

## 2017-03-24 HISTORY — DX: Diverticulitis of intestine, part unspecified, without perforation or abscess without bleeding: K57.92

## 2017-03-24 LAB — CBC WITH DIFFERENTIAL/PLATELET
BASOS ABS: 0 10*3/uL (ref 0.0–0.1)
Basophils Relative: 1 %
EOS ABS: 0.1 10*3/uL (ref 0.0–0.7)
Eosinophils Relative: 2 %
HCT: 41.2 % (ref 36.0–46.0)
HEMOGLOBIN: 13.6 g/dL (ref 12.0–15.0)
LYMPHS ABS: 1.8 10*3/uL (ref 0.7–4.0)
LYMPHS PCT: 26 %
MCH: 30.7 pg (ref 26.0–34.0)
MCHC: 33 g/dL (ref 30.0–36.0)
MCV: 93 fL (ref 78.0–100.0)
MONOS PCT: 10 %
Monocytes Absolute: 0.7 10*3/uL (ref 0.1–1.0)
NEUTROS PCT: 61 %
Neutro Abs: 4.3 10*3/uL (ref 1.7–7.7)
PLATELETS: 315 10*3/uL (ref 150–400)
RBC: 4.43 MIL/uL (ref 3.87–5.11)
RDW: 13.5 % (ref 11.5–15.5)
WBC: 6.9 10*3/uL (ref 4.0–10.5)

## 2017-03-24 LAB — URINALYSIS, ROUTINE W REFLEX MICROSCOPIC
BILIRUBIN URINE: NEGATIVE
GLUCOSE, UA: NEGATIVE mg/dL
KETONES UR: NEGATIVE mg/dL
NITRITE: NEGATIVE
PROTEIN: NEGATIVE mg/dL
Specific Gravity, Urine: 1.004 — ABNORMAL LOW (ref 1.005–1.030)
pH: 5.5 (ref 5.0–8.0)

## 2017-03-24 LAB — URINALYSIS, MICROSCOPIC (REFLEX)

## 2017-03-24 LAB — COMPREHENSIVE METABOLIC PANEL
ALBUMIN: 3.8 g/dL (ref 3.5–5.0)
ALT: 41 U/L (ref 14–54)
AST: 27 U/L (ref 15–41)
Alkaline Phosphatase: 119 U/L (ref 38–126)
Anion gap: 7 (ref 5–15)
BUN: 10 mg/dL (ref 6–20)
CHLORIDE: 105 mmol/L (ref 101–111)
CO2: 26 mmol/L (ref 22–32)
CREATININE: 0.93 mg/dL (ref 0.44–1.00)
Calcium: 9.5 mg/dL (ref 8.9–10.3)
GFR calc Af Amer: >60 mL/min (ref >60)
GFR calc non Af Amer: >60 mL/min (ref >60)
GLUCOSE: 83 mg/dL (ref 65–99)
Potassium: 4 mmol/L (ref 3.5–5.1)
SODIUM: 138 mmol/L (ref 135–145)
Total Bilirubin: 0.5 mg/dL (ref 0.3–1.2)
Total Protein: 7.3 g/dL (ref 6.5–8.1)

## 2017-03-24 LAB — PREGNANCY, URINE: Preg Test, Ur: NEGATIVE

## 2017-03-24 MED ORDER — CIPROFLOXACIN HCL 500 MG PO TABS: 500.0000 mg | ORAL_TABLET | Freq: Two times a day (BID) | ORAL | 0 refills | Status: AC

## 2017-03-24 MED ORDER — MORPHINE SULFATE (PF) 4 MG/ML IV SOLN
4.0000 mg | Freq: Once | INTRAVENOUS | Status: AC
  Administered 2017-03-24: 4 mg via INTRAVENOUS
  Filled 2017-03-24: qty 1

## 2017-03-24 MED ORDER — METRONIDAZOLE 500 MG PO TABS: 500.0000 mg | ORAL_TABLET | Freq: Three times a day (TID) | ORAL | 0 refills | Status: AC

## 2017-03-24 MED ORDER — ONDANSETRON HCL 4 MG/2ML IJ SOLN
4.0000 mg | Freq: Once | INTRAMUSCULAR | Status: AC
  Administered 2017-03-24: 4 mg via INTRAVENOUS
  Filled 2017-03-24: qty 2

## 2017-03-24 MED ORDER — HYDROCODONE-ACETAMINOPHEN 5-325 MG PO TABS: 1.0000 | ORAL_TABLET | Freq: Four times a day (QID) | ORAL | 0 refills | Status: AC | PRN

## 2017-03-24 MED ORDER — IOPAMIDOL (ISOVUE-300) INJECTION 61%
100.0000 mL | Freq: Once | INTRAVENOUS | Status: AC | PRN
  Administered 2017-03-24: 100 mL via INTRAVENOUS

## 2017-03-24 MED ORDER — HYDROCODONE-ACETAMINOPHEN 5-325 MG PO TABS
2.0000 | ORAL_TABLET | Freq: Once | ORAL | Status: AC
Start: 2017-03-24 — End: 2017-03-24
  Administered 2017-03-24: 2 via ORAL
  Filled 2017-03-24: qty 2

## 2017-03-24 NOTE — ED Triage Notes (Signed)
Pt reports lower abdominal cramping since Wednesday. Has antibiotic for diverticulitis so took that but pain is not better. Reports fever this weekend

## 2017-03-24 NOTE — ED Provider Notes (Signed)
[facility]   CSN: [id number] Arrival date & time: 03/24/17  1726  By signing my name below, I, Veronica Bailey, attest that this documentation has been prepared under the direction and in the presence of Veronica Lyons, MD . Electronically Signed: Teofilo Bailey, ED Scribe. 03/24/2017. 8:19 PM.    History   Chief Complaint Chief Complaint  Patient presents with  . Abdominal Pain    The history is provided by the patient. No language interpreter was used.   HPI Comments:  Veronica Bailey is a 51 y.o. female with PMHx of diverticulitis who presents to the Emergency Department complaining of constant left sided abdominal pain x 6 days. She states that the pain radiates downward. Pt was seen by a GI doc 4 months ago for the same and was told that the next time she had a similar episode of pain to take Augmentin. She has taken Augmentin for the past 3 days with no relief. She reports that she noticed some mucous in her BMs since the onset of pain. Pt had back surgery 2 weeks ago. Denies urinary symptoms.   Past Medical History:  Diagnosis Date  . Diverticulitis   . Migraine     There are no active problems to display for this patient.   Past Surgical History:  Procedure Laterality Date  . APPENDECTOMY    . BACK SURGERY    . CHOLECYSTECTOMY    . LAPAROSCOPIC GASTRIC BANDING    . LAPAROSCOPIC REVISION OF GASTRIC BAND      OB History    No data available       Home Medications    Prior to Admission medications   Medication Sig Start Date End Date Taking? Authorizing Provider  buPROPion (WELLBUTRIN XL) 300 MG 24 hr tablet Take 300 mg by mouth daily.   Yes Historical Provider, MD  FIBER PO Take 1 tablet by mouth at bedtime.   Yes Historical Provider, MD  levonorgestrel (MIRENA) 20 MCG/24HR IUD 1 each by Intrauterine route once.   Yes Historical Provider, MD  magnesium gluconate (MAGONATE) 500 MG tablet Take 500 mg by mouth every evening.    Yes Historical Provider, MD  ondansetron (ZOFRAN ODT) 4 MG disintegrating tablet Take 1 tablet (4 mg total) by mouth every 8 (eight) hours as needed for nausea. 10/09/16  Yes Rolland Porter, MD  rizatriptan (MAXALT) 10 MG tablet Take 10 mg by mouth daily as needed for migraine. May repeat in 2 hours if needed   Yes Historical Provider, MD  zolpidem (AMBIEN) 10 MG tablet Take 5 mg by mouth at bedtime.    Yes Historical Provider, MD  ciprofloxacin (CIPRO) 500 MG tablet Take 1 tablet (500 mg total) by mouth 2 (two) times daily. One po bid x 7 days Patient taking differently: Take 500 mg by mouth 2 (two) times daily.  03/20/13   Veronica Andrew, PA-C  clindamycin (CLEOCIN) 300 MG capsule Take 1 capsule (300 mg total) by mouth every 6 (six) hours. 10/09/16   Rolland Porter, MD  HYDROcodone-acetaminophen (NORCO/VICODIN) 5-325 MG tablet Take 1 tablet by mouth every 4 (four) hours as needed. 10/09/16   Rolland Porter, MD    Family History No family history on file.  Social History Social History  Substance Use Topics  . Smoking status: Never Smoker  . Smokeless tobacco: Never Used  . Alcohol use No     Allergies   Benadryl [diphenhydramine hcl] and Imitrex [sumatriptan]   Review of  Systems Review of Systems  Gastrointestinal: Positive for abdominal pain.  Genitourinary: Negative for dysuria and hematuria.  All other systems reviewed and are negative.    Physical Exam Updated Vital Signs BP (!) 143/108 (BP Location: Right Arm)   Pulse 67   Temp 98.4 F (36.9 C) (Oral)   Resp 16   Ht  (1.626 m)   Wt 170 lb (77.1 kg)   SpO2 100%   BMI 29.18 kg/m   Physical Exam  Constitutional: She is oriented to person, place, and time. She appears well-developed and well-nourished. No distress.  HENT:  Head: Normocephalic and atraumatic.  Eyes: EOM are normal.  Neck: Normal range of motion.  Cardiovascular: Normal rate, regular rhythm and normal heart sounds.   Pulmonary/Chest: Effort normal and breath  sounds normal.  Abdominal: Soft. She exhibits no distension. There is tenderness (TTP in LLQ). There is no rebound and no guarding.  Musculoskeletal: Normal range of motion.  Neurological: She is alert and oriented to person, place, and time.  Skin: Skin is warm and dry.  Psychiatric: She has a normal mood and affect. Judgment normal.  Nursing note and vitals reviewed.    ED Treatments / Results  DIAGNOSTIC STUDIES:  Oxygen Saturation is 100% on RA, normal by my interpretation.    COORDINATION OF CARE:  8:18 PM Will order CT. Discussed treatment plan with pt at bedside and pt agreed to plan.   Labs (all labs ordered are listed, but only abnormal results are displayed) Labs Reviewed  URINALYSIS, ROUTINE W REFLEX MICROSCOPIC - Abnormal; Notable for the following:       Result Value   Specific Gravity, Urine 1.004 (*)    Hgb urine dipstick TRACE (*)    Leukocytes, UA TRACE (*)    All other components within normal limits  URINALYSIS, MICROSCOPIC (REFLEX) - Abnormal; Notable for the following:    Bacteria, UA RARE (*)    Squamous Epithelial / LPF 6-30 (*)    All other components within normal limits  PREGNANCY, URINE  CBC WITH DIFFERENTIAL/PLATELET  COMPREHENSIVE METABOLIC PANEL    EKG  EKG Interpretation None       Radiology No results found.  Procedures Procedures (including critical care time)  Medications Ordered in ED Medications - No data to display   Initial Impression / Assessment and Plan / ED Course  I have reviewed the triage vital signs and the nursing notes.  Pertinent labs & imaging results that were available during my care of the patient were reviewed by me and considered in my medical decision making (see chart for details).  Patient presents with complaints of left lower quadrant abdominal pain with a history of diverticulitis. Her CT scan shows mild diverticulitis without evidence of abscess or perforation. She has been on Augmentin for the  past 3 days and I will advise her to continue this. If she is not improving in the next 1-2 days, I will have her fill the prescription for Cipro and Flagyl as she has been given. She will also be given pain medication she can take as needed.  Final Clinical Impressions(s) / ED Diagnoses   Final diagnoses:  None    New Prescriptions New Prescriptions   No medications on file  I personally performed the services described in this documentation, which was scribed in my presence. The recorded information has been reviewed and is accurate.        Veronica Lyons, MD 03/24/17 [phone]

## 2017-03-24 NOTE — ED Notes (Signed)
ED Provider at bedside discussing test results and dispo plan of care. 

## 2017-03-24 NOTE — ED Notes (Signed)
ED Provider at bedside. 

## 2017-03-24 NOTE — Discharge Instructions (Signed)
Continue taking Augmentin as prescribed.  If symptoms are not improving in the next 1-2 days, discontinue the Augmentin and begin taking Cipro and Flagyl as prescribed this evening.  Hydrocodone as prescribed as needed for pain.  Follow-up with your gastroenterologist in the next week if not improving, and return to the emergency department if symptoms significantly worsen or change.

## 2017-03-24 NOTE — ED Notes (Signed)
Patient transported to CT 

## 2017-04-08 DIAGNOSIS — Z1231 Encounter for screening mammogram for malignant neoplasm of breast: Secondary | ICD-10-CM | POA: Diagnosis not present

## 2017-04-14 DIAGNOSIS — J209 Acute bronchitis, unspecified: Secondary | ICD-10-CM | POA: Diagnosis not present

## 2017-05-30 ENCOUNTER — Encounter (HOSPITAL_BASED_OUTPATIENT_CLINIC_OR_DEPARTMENT_OTHER): Payer: Self-pay

## 2017-05-30 ENCOUNTER — Emergency Department (HOSPITAL_BASED_OUTPATIENT_CLINIC_OR_DEPARTMENT_OTHER)
Admission: EM | Admit: 2017-05-30 | Discharge: 2017-05-30 | Disposition: A | Discharge status: Home / Self Care | Payer: BLUE CROSS/BLUE SHIELD | Attending: Emergency Medicine | Admitting: Emergency Medicine

## 2017-05-30 DIAGNOSIS — G43009 Migraine without aura, not intractable, without status migrainosus: Secondary | ICD-10-CM | POA: Diagnosis not present

## 2017-05-30 DIAGNOSIS — Z79899 Other long term (current) drug therapy: Secondary | ICD-10-CM | POA: Insufficient documentation

## 2017-05-30 DIAGNOSIS — Z7983 Long term (current) use of bisphosphonates: Secondary | ICD-10-CM | POA: Diagnosis not present

## 2017-05-30 DIAGNOSIS — R51 Headache: Secondary | ICD-10-CM | POA: Diagnosis present

## 2017-05-30 DIAGNOSIS — G43909 Migraine, unspecified, not intractable, without status migrainosus: Secondary | ICD-10-CM

## 2017-05-30 MED ORDER — PROCHLORPERAZINE EDISYLATE 5 MG/ML IJ SOLN
10.0000 mg | Freq: Once | INTRAMUSCULAR | Status: AC
  Administered 2017-05-30: 10 mg via INTRAVENOUS
  Filled 2017-05-30: qty 2

## 2017-05-30 MED ORDER — SODIUM CHLORIDE 0.9 % IV BOLUS (SEPSIS)
1000.0000 mL | Freq: Once | INTRAVENOUS | Status: AC
  Administered 2017-05-30: 1000 mL via INTRAVENOUS

## 2017-05-30 NOTE — Discharge Instructions (Signed)
Followup with your doctor. Return for any new or worse symptoms. °

## 2017-05-30 NOTE — ED Triage Notes (Signed)
Pt c/o migraine type headache since about 0300 this morning with n/v and photosensitivity.  Pt tried her maxalt at home and a dose of zofran and did not have relief.

## 2017-05-30 NOTE — ED Provider Notes (Signed)
[facility]   CSN: [id number] Arrival date & time: 05/30/17  4098     History   Chief Complaint Chief Complaint  Patient presents with  . Headache    HPI Veronica Bailey is a 51 y.o. female.  The history is provided by the patient.  Headache    She was awakened at 3 AM by severe left-sided headache typical of her migraines. She describes the pain as a throbbing and she rates it at 10/10. There is associated photophobia, nausea, vomiting. She denies visual change, weakness, numbness. She took a dose of Maxalt without relief. She developed nausea and took a dose of ondansetron, and then took another dose of Maxalt. She still did not get relief.  Past Medical History:  Diagnosis Date  . Diverticulitis   . Migraine     There are no active problems to display for this patient.   Past Surgical History:  Procedure Laterality Date  . APPENDECTOMY    . BACK SURGERY    . CHOLECYSTECTOMY    . LAPAROSCOPIC GASTRIC BANDING    . LAPAROSCOPIC REVISION OF GASTRIC BAND      OB History    No data available       Home Medications    Prior to Admission medications   Medication Sig Start Date End Date Taking? Authorizing Provider  buPROPion (WELLBUTRIN XL) 300 MG 24 hr tablet Take 300 mg by mouth daily.    [provider]  ciprofloxacin (CIPRO) 500 MG tablet Take 1 tablet (500 mg total) by mouth 2 (two) times daily. One po bid x 7 days 03/24/17   Geoffery Lyons, MD  clindamycin (CLEOCIN) 300 MG capsule Take 1 capsule (300 mg total) by mouth every 6 (six) hours. 10/09/16   Rolland Porter, MD  FIBER PO Take 1 tablet by mouth at bedtime.    [provider]  HYDROcodone-acetaminophen (NORCO/VICODIN) 5-325 MG tablet Take 1-2 tablets by mouth every 6 (six) hours as needed. 03/24/17   Geoffery Lyons, MD  levonorgestrel (MIRENA) 20 MCG/24HR IUD 1 each by Intrauterine route once.    [provider]  magnesium gluconate (MAGONATE) 500 MG tablet  Take 500 mg by mouth every evening.    [provider]  metroNIDAZOLE (FLAGYL) 500 MG tablet Take 1 tablet (500 mg total) by mouth 3 (three) times daily. One po tid x 7 days 03/24/17   Geoffery Lyons, MD  ondansetron (ZOFRAN ODT) 4 MG disintegrating tablet Take 1 tablet (4 mg total) by mouth every 8 (eight) hours as needed for nausea. 10/09/16   Rolland Porter, MD  rizatriptan (MAXALT) 10 MG tablet Take 10 mg by mouth daily as needed for migraine. May repeat in 2 hours if needed    [provider]  zolpidem (AMBIEN) 10 MG tablet Take 5 mg by mouth at bedtime.     [provider]    Family History No family history on file.  Social History Social History  Substance Use Topics  . Smoking status: Never Smoker  . Smokeless tobacco: Never Used  . Alcohol use No     Allergies   Benadryl [diphenhydramine hcl] and Imitrex [sumatriptan]   Review of Systems Review of Systems  Neurological: Positive for headaches.  All other systems reviewed and are negative.    Physical Exam Updated Vital Signs BP (!) 150/101 (BP Location: Right Arm)   Pulse 63   Temp 97.6 F (36.4 C) (Oral)   Resp 20   Ht  5\' 4"  (1.626 m)   Wt 75.3 kg (166 lb)   SpO2 100%   BMI 28.49 kg/m   Physical Exam  Nursing note and vitals reviewed.  51 year old female, who appears uncomfortable, but his in no acute distress. Vital signs are significant for hypertension. Oxygen saturation is 100%, which is normal. Head is normocephalic and atraumatic. PERRLA, EOMI. Oropharynx is clear. Neck is nontender and supple without adenopathy or JVD. Back is nontender and there is no CVA tenderness. Lungs are clear without rales, wheezes, or rhonchi. Chest is nontender. Heart has regular rate and rhythm without murmur. Abdomen is soft, flat, nontender without masses or hepatosplenomegaly and peristalsis is normoactive. Extremities have no cyanosis or edema, full range of motion is present. Skin is warm  and dry without rash. Neurologic: Mental status is normal, cranial nerves are intact, there are no motor or sensory deficits.  ED Treatments / Results   Procedures Procedures (including critical care time)  Medications Ordered in ED Medications  sodium chloride 0.9 % bolus 1,000 mL (not administered)  prochlorperazine (COMPAZINE) injection 10 mg (not administered)     Initial Impression / Assessment and Plan / ED Course  I have reviewed the triage vital signs and the nursing notes.  Migraine headache. Old records are reviewed, and she has no relevant past visits. She is given a migraine cocktail of prochlorperazine and normal saline. Case is signed out to Dr. Deretha EmoryZackowski.  Final Clinical Impressions(s) / ED Diagnoses   Final diagnoses:  Migraine without aura and without status migrainosus, not intractable    New Prescriptions New Prescriptions   No medications on file     Dione BoozeGlick, Shaquel Josephson, MD 05/30/17 [phone]

## 2017-06-02 DIAGNOSIS — L03031 Cellulitis of right toe: Secondary | ICD-10-CM | POA: Diagnosis not present

## 2017-06-02 DIAGNOSIS — M79671 Pain in right foot: Secondary | ICD-10-CM | POA: Diagnosis not present

## 2017-06-02 DIAGNOSIS — M722 Plantar fascial fibromatosis: Secondary | ICD-10-CM | POA: Diagnosis not present

## 2017-06-17 DIAGNOSIS — Z9889 Other specified postprocedural states: Secondary | ICD-10-CM | POA: Diagnosis not present

## 2017-06-17 DIAGNOSIS — M5127 Other intervertebral disc displacement, lumbosacral region: Secondary | ICD-10-CM | POA: Diagnosis not present

## 2017-06-17 DIAGNOSIS — M5442 Lumbago with sciatica, left side: Secondary | ICD-10-CM | POA: Diagnosis not present

## 2017-06-17 DIAGNOSIS — M21372 Foot drop, left foot: Secondary | ICD-10-CM | POA: Diagnosis not present

## 2017-06-20 DIAGNOSIS — M5127 Other intervertebral disc displacement, lumbosacral region: Secondary | ICD-10-CM | POA: Diagnosis not present

## 2017-06-20 DIAGNOSIS — M5442 Lumbago with sciatica, left side: Secondary | ICD-10-CM | POA: Diagnosis not present

## 2017-06-20 DIAGNOSIS — Z9889 Other specified postprocedural states: Secondary | ICD-10-CM | POA: Diagnosis not present

## 2017-06-20 DIAGNOSIS — M21372 Foot drop, left foot: Secondary | ICD-10-CM | POA: Diagnosis not present

## 2017-06-24 DIAGNOSIS — M5127 Other intervertebral disc displacement, lumbosacral region: Secondary | ICD-10-CM | POA: Diagnosis not present

## 2017-06-24 DIAGNOSIS — Z9889 Other specified postprocedural states: Secondary | ICD-10-CM | POA: Diagnosis not present

## 2017-06-24 DIAGNOSIS — M5442 Lumbago with sciatica, left side: Secondary | ICD-10-CM | POA: Diagnosis not present

## 2017-06-24 DIAGNOSIS — M21372 Foot drop, left foot: Secondary | ICD-10-CM | POA: Diagnosis not present

## 2017-06-25 DIAGNOSIS — M79672 Pain in left foot: Secondary | ICD-10-CM | POA: Diagnosis not present

## 2017-06-25 DIAGNOSIS — B351 Tinea unguium: Secondary | ICD-10-CM | POA: Diagnosis not present

## 2017-06-25 DIAGNOSIS — M79671 Pain in right foot: Secondary | ICD-10-CM | POA: Diagnosis not present

## 2017-06-26 DIAGNOSIS — M21372 Foot drop, left foot: Secondary | ICD-10-CM | POA: Diagnosis not present

## 2017-06-26 DIAGNOSIS — M5442 Lumbago with sciatica, left side: Secondary | ICD-10-CM | POA: Diagnosis not present

## 2017-06-26 DIAGNOSIS — M5127 Other intervertebral disc displacement, lumbosacral region: Secondary | ICD-10-CM | POA: Diagnosis not present

## 2017-06-26 DIAGNOSIS — Z9889 Other specified postprocedural states: Secondary | ICD-10-CM | POA: Diagnosis not present

## 2017-06-30 DIAGNOSIS — M5127 Other intervertebral disc displacement, lumbosacral region: Secondary | ICD-10-CM | POA: Diagnosis not present

## 2017-06-30 DIAGNOSIS — Z9889 Other specified postprocedural states: Secondary | ICD-10-CM | POA: Diagnosis not present

## 2017-06-30 DIAGNOSIS — M21372 Foot drop, left foot: Secondary | ICD-10-CM | POA: Diagnosis not present

## 2017-06-30 DIAGNOSIS — M5442 Lumbago with sciatica, left side: Secondary | ICD-10-CM | POA: Diagnosis not present

## 2017-07-02 DIAGNOSIS — Z9889 Other specified postprocedural states: Secondary | ICD-10-CM | POA: Diagnosis not present

## 2017-07-02 DIAGNOSIS — M5127 Other intervertebral disc displacement, lumbosacral region: Secondary | ICD-10-CM | POA: Diagnosis not present

## 2017-07-02 DIAGNOSIS — M5442 Lumbago with sciatica, left side: Secondary | ICD-10-CM | POA: Diagnosis not present

## 2017-07-02 DIAGNOSIS — M21372 Foot drop, left foot: Secondary | ICD-10-CM | POA: Diagnosis not present

## 2017-07-09 DIAGNOSIS — Z9889 Other specified postprocedural states: Secondary | ICD-10-CM | POA: Diagnosis not present

## 2017-07-09 DIAGNOSIS — M21372 Foot drop, left foot: Secondary | ICD-10-CM | POA: Diagnosis not present

## 2017-07-09 DIAGNOSIS — M5127 Other intervertebral disc displacement, lumbosacral region: Secondary | ICD-10-CM | POA: Diagnosis not present

## 2017-07-09 DIAGNOSIS — M5442 Lumbago with sciatica, left side: Secondary | ICD-10-CM | POA: Diagnosis not present

## 2017-07-10 DIAGNOSIS — L814 Other melanin hyperpigmentation: Secondary | ICD-10-CM | POA: Diagnosis not present

## 2017-07-10 DIAGNOSIS — L820 Inflamed seborrheic keratosis: Secondary | ICD-10-CM | POA: Diagnosis not present

## 2017-07-17 DIAGNOSIS — Z9889 Other specified postprocedural states: Secondary | ICD-10-CM | POA: Diagnosis not present

## 2017-07-17 DIAGNOSIS — M21372 Foot drop, left foot: Secondary | ICD-10-CM | POA: Diagnosis not present

## 2017-07-17 DIAGNOSIS — M5127 Other intervertebral disc displacement, lumbosacral region: Secondary | ICD-10-CM | POA: Diagnosis not present

## 2017-07-17 DIAGNOSIS — M5442 Lumbago with sciatica, left side: Secondary | ICD-10-CM | POA: Diagnosis not present

## 2017-07-30 DIAGNOSIS — M5127 Other intervertebral disc displacement, lumbosacral region: Secondary | ICD-10-CM | POA: Diagnosis not present

## 2017-07-30 DIAGNOSIS — M21372 Foot drop, left foot: Secondary | ICD-10-CM | POA: Diagnosis not present

## 2017-07-30 DIAGNOSIS — Z9889 Other specified postprocedural states: Secondary | ICD-10-CM | POA: Diagnosis not present

## 2017-07-30 DIAGNOSIS — M5442 Lumbago with sciatica, left side: Secondary | ICD-10-CM | POA: Diagnosis not present

## 2017-08-12 DIAGNOSIS — Z6829 Body mass index (BMI) 29.0-29.9, adult: Secondary | ICD-10-CM | POA: Diagnosis not present

## 2017-08-12 DIAGNOSIS — M5417 Radiculopathy, lumbosacral region: Secondary | ICD-10-CM | POA: Diagnosis not present

## 2017-08-12 DIAGNOSIS — R030 Elevated blood-pressure reading, without diagnosis of hypertension: Secondary | ICD-10-CM | POA: Diagnosis not present

## 2017-08-13 DIAGNOSIS — M5127 Other intervertebral disc displacement, lumbosacral region: Secondary | ICD-10-CM | POA: Diagnosis not present

## 2017-08-13 DIAGNOSIS — M21372 Foot drop, left foot: Secondary | ICD-10-CM | POA: Diagnosis not present

## 2017-08-13 DIAGNOSIS — M5442 Lumbago with sciatica, left side: Secondary | ICD-10-CM | POA: Diagnosis not present

## 2017-08-13 DIAGNOSIS — Z9889 Other specified postprocedural states: Secondary | ICD-10-CM | POA: Diagnosis not present

## 2017-09-11 DIAGNOSIS — M5127 Other intervertebral disc displacement, lumbosacral region: Secondary | ICD-10-CM | POA: Diagnosis not present

## 2017-09-11 DIAGNOSIS — M21372 Foot drop, left foot: Secondary | ICD-10-CM | POA: Diagnosis not present

## 2017-09-11 DIAGNOSIS — M5442 Lumbago with sciatica, left side: Secondary | ICD-10-CM | POA: Diagnosis not present

## 2017-09-11 DIAGNOSIS — Z9889 Other specified postprocedural states: Secondary | ICD-10-CM | POA: Diagnosis not present

## 2017-10-01 DIAGNOSIS — K5732 Diverticulitis of large intestine without perforation or abscess without bleeding: Secondary | ICD-10-CM | POA: Diagnosis not present

## 2017-10-01 DIAGNOSIS — K5730 Diverticulosis of large intestine without perforation or abscess without bleeding: Secondary | ICD-10-CM | POA: Diagnosis not present

## 2017-10-06 DIAGNOSIS — K5732 Diverticulitis of large intestine without perforation or abscess without bleeding: Secondary | ICD-10-CM | POA: Diagnosis not present

## 2017-10-06 DIAGNOSIS — Z1211 Encounter for screening for malignant neoplasm of colon: Secondary | ICD-10-CM | POA: Diagnosis not present

## 2017-10-16 DIAGNOSIS — K5733 Diverticulitis of large intestine without perforation or abscess with bleeding: Secondary | ICD-10-CM | POA: Diagnosis not present

## 2017-10-16 DIAGNOSIS — R5383 Other fatigue: Secondary | ICD-10-CM | POA: Diagnosis not present

## 2017-10-16 DIAGNOSIS — Z8719 Personal history of other diseases of the digestive system: Secondary | ICD-10-CM | POA: Diagnosis not present

## 2017-10-16 DIAGNOSIS — R110 Nausea: Secondary | ICD-10-CM | POA: Diagnosis not present

## 2017-10-16 DIAGNOSIS — K5732 Diverticulitis of large intestine without perforation or abscess without bleeding: Secondary | ICD-10-CM | POA: Diagnosis not present

## 2017-10-16 DIAGNOSIS — Z9889 Other specified postprocedural states: Secondary | ICD-10-CM | POA: Diagnosis not present

## 2017-10-16 DIAGNOSIS — R42 Dizziness and giddiness: Secondary | ICD-10-CM | POA: Diagnosis not present

## 2017-10-16 DIAGNOSIS — R1032 Left lower quadrant pain: Secondary | ICD-10-CM | POA: Diagnosis not present

## 2017-10-29 DIAGNOSIS — Z01818 Encounter for other preprocedural examination: Secondary | ICD-10-CM | POA: Diagnosis not present

## 2017-10-29 DIAGNOSIS — K5730 Diverticulosis of large intestine without perforation or abscess without bleeding: Secondary | ICD-10-CM | POA: Diagnosis not present

## 2017-11-10 DIAGNOSIS — Z9884 Bariatric surgery status: Secondary | ICD-10-CM | POA: Diagnosis not present

## 2017-11-10 DIAGNOSIS — K5732 Diverticulitis of large intestine without perforation or abscess without bleeding: Secondary | ICD-10-CM | POA: Diagnosis not present

## 2017-11-10 DIAGNOSIS — Z9049 Acquired absence of other specified parts of digestive tract: Secondary | ICD-10-CM | POA: Diagnosis not present

## 2017-11-10 DIAGNOSIS — F329 Major depressive disorder, single episode, unspecified: Secondary | ICD-10-CM | POA: Diagnosis not present

## 2017-11-10 DIAGNOSIS — K5730 Diverticulosis of large intestine without perforation or abscess without bleeding: Secondary | ICD-10-CM | POA: Diagnosis not present

## 2017-11-10 DIAGNOSIS — I444 Left anterior fascicular block: Secondary | ICD-10-CM | POA: Diagnosis not present

## 2017-11-10 DIAGNOSIS — R9431 Abnormal electrocardiogram [ECG] [EKG]: Secondary | ICD-10-CM | POA: Diagnosis not present

## 2017-11-10 DIAGNOSIS — K219 Gastro-esophageal reflux disease without esophagitis: Secondary | ICD-10-CM | POA: Diagnosis not present

## 2017-11-11 DIAGNOSIS — R9431 Abnormal electrocardiogram [ECG] [EKG]: Secondary | ICD-10-CM | POA: Diagnosis not present

## 2017-11-11 DIAGNOSIS — I444 Left anterior fascicular block: Secondary | ICD-10-CM | POA: Diagnosis not present

## 2017-11-14 DIAGNOSIS — R9431 Abnormal electrocardiogram [ECG] [EKG]: Secondary | ICD-10-CM | POA: Diagnosis not present

## 2017-11-14 DIAGNOSIS — K5730 Diverticulosis of large intestine without perforation or abscess without bleeding: Secondary | ICD-10-CM | POA: Diagnosis not present

## 2017-11-24 DIAGNOSIS — G43009 Migraine without aura, not intractable, without status migrainosus: Secondary | ICD-10-CM | POA: Diagnosis not present

## 2017-11-24 DIAGNOSIS — R9431 Abnormal electrocardiogram [ECG] [EKG]: Secondary | ICD-10-CM | POA: Diagnosis not present

## 2017-11-27 DIAGNOSIS — D225 Melanocytic nevi of trunk: Secondary | ICD-10-CM | POA: Diagnosis not present

## 2017-11-27 DIAGNOSIS — L814 Other melanin hyperpigmentation: Secondary | ICD-10-CM | POA: Diagnosis not present

## 2017-12-04 DIAGNOSIS — N3001 Acute cystitis with hematuria: Secondary | ICD-10-CM | POA: Diagnosis not present

## 2017-12-04 DIAGNOSIS — R399 Unspecified symptoms and signs involving the genitourinary system: Secondary | ICD-10-CM | POA: Diagnosis not present

## 2017-12-31 DIAGNOSIS — N390 Urinary tract infection, site not specified: Secondary | ICD-10-CM | POA: Diagnosis not present

## 2018-01-05 DIAGNOSIS — Z6827 Body mass index (BMI) 27.0-27.9, adult: Secondary | ICD-10-CM | POA: Diagnosis not present

## 2018-01-05 DIAGNOSIS — Z01419 Encounter for gynecological examination (general) (routine) without abnormal findings: Secondary | ICD-10-CM | POA: Diagnosis not present

## 2018-01-05 DIAGNOSIS — N760 Acute vaginitis: Secondary | ICD-10-CM | POA: Diagnosis not present

## 2018-01-09 DIAGNOSIS — B952 Enterococcus as the cause of diseases classified elsewhere: Secondary | ICD-10-CM | POA: Diagnosis not present

## 2018-01-09 DIAGNOSIS — R300 Dysuria: Secondary | ICD-10-CM | POA: Diagnosis not present

## 2018-01-09 DIAGNOSIS — N390 Urinary tract infection, site not specified: Secondary | ICD-10-CM | POA: Diagnosis not present

## 2018-01-09 DIAGNOSIS — R350 Frequency of micturition: Secondary | ICD-10-CM | POA: Diagnosis not present

## 2018-05-01 DIAGNOSIS — Z1231 Encounter for screening mammogram for malignant neoplasm of breast: Secondary | ICD-10-CM | POA: Diagnosis not present

## 2018-05-07 DIAGNOSIS — K432 Incisional hernia without obstruction or gangrene: Secondary | ICD-10-CM | POA: Diagnosis not present

## 2018-05-13 DIAGNOSIS — K432 Incisional hernia without obstruction or gangrene: Secondary | ICD-10-CM | POA: Diagnosis not present

## 2018-05-13 DIAGNOSIS — Z9884 Bariatric surgery status: Secondary | ICD-10-CM | POA: Diagnosis not present

## 2018-06-08 DIAGNOSIS — K432 Incisional hernia without obstruction or gangrene: Secondary | ICD-10-CM | POA: Diagnosis not present

## 2018-06-16 DIAGNOSIS — K432 Incisional hernia without obstruction or gangrene: Secondary | ICD-10-CM | POA: Diagnosis not present

## 2018-06-16 DIAGNOSIS — G8918 Other acute postprocedural pain: Secondary | ICD-10-CM | POA: Diagnosis not present

## 2018-06-17 DIAGNOSIS — K432 Incisional hernia without obstruction or gangrene: Secondary | ICD-10-CM | POA: Diagnosis not present

## 2018-06-18 DIAGNOSIS — K432 Incisional hernia without obstruction or gangrene: Secondary | ICD-10-CM | POA: Diagnosis not present

## 2018-07-04 IMAGING — CT CT ABD-PELV W/ CM
2 of 4 series · 16 of 46 positions shown, 18 images · IV contrast (ISOVUE)
Comparison: CT abdomen pelvis 03/19/2013

CLINICAL DATA: Suprapubic pain.  Question diverticulitis.

EXAM:
CT ABDOMEN AND PELVIS WITH CONTRAST
TECHNIQUE: Multidetector CT imaging of the abdomen and pelvis was performed
using the standard protocol following bolus administration of
intravenous contrast.
CONTRAST:  100mL 6AAJZV-6YY IOPAMIDOL (6AAJZV-6YY) INJECTION 61%

[Series 2: abd/pel with · axial · 0.92mm/px · z∈[+528,+978]mm · 13 of 100 slices shown, 15 images]
[im 5/100  soft-tissue]
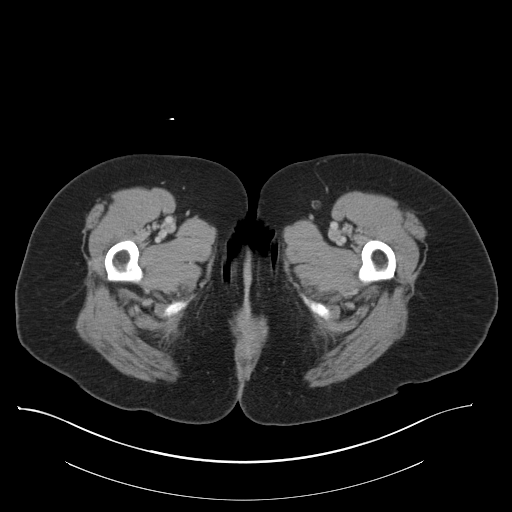
[im 5/100  bone]
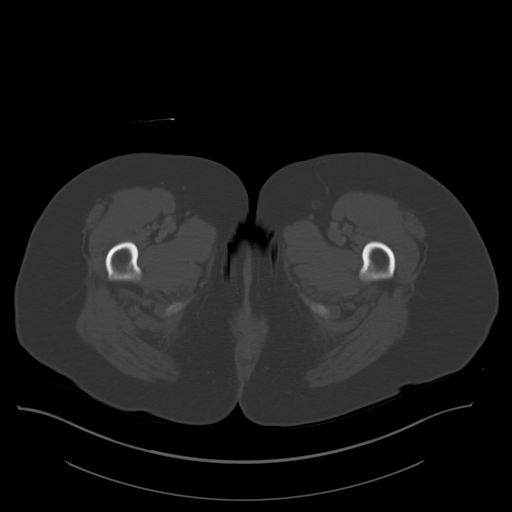
[im 13/100  soft-tissue]
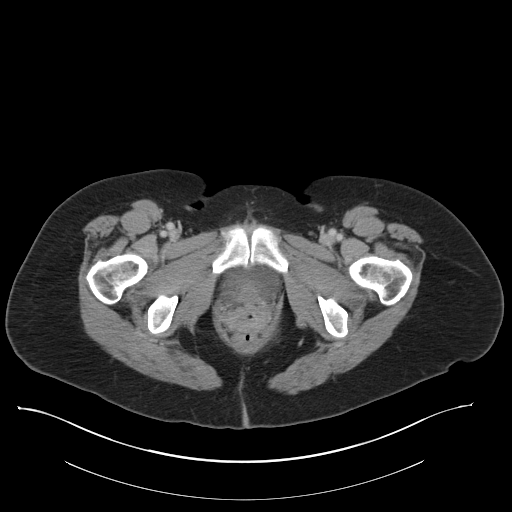
[im 21/100  soft-tissue]
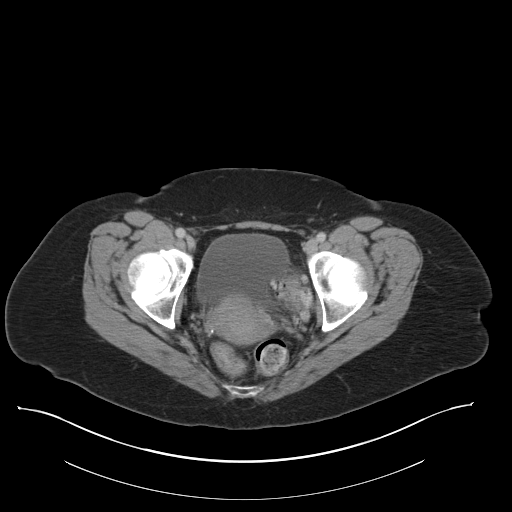
[im 29/100  soft-tissue]
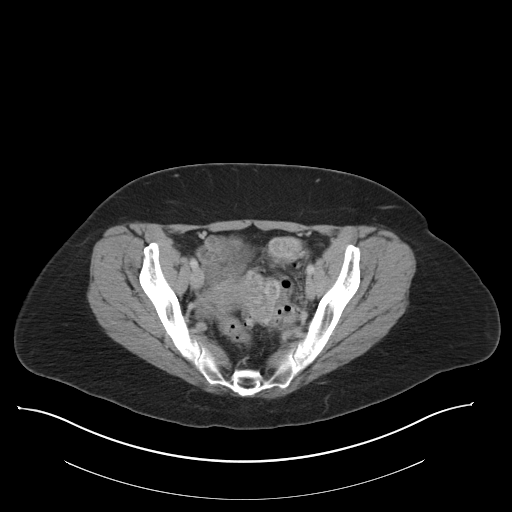
[im 34/100  soft-tissue]
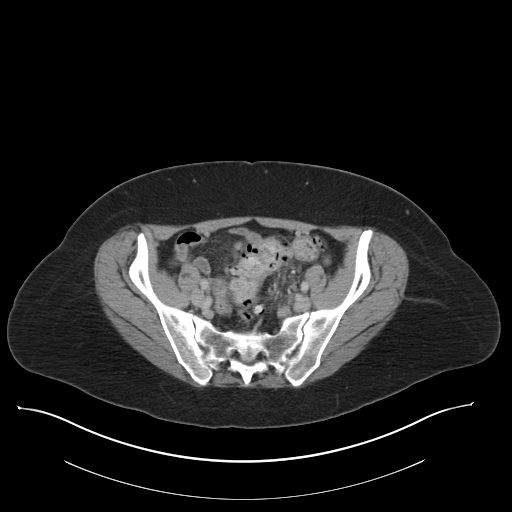
[im 42/100  soft-tissue]
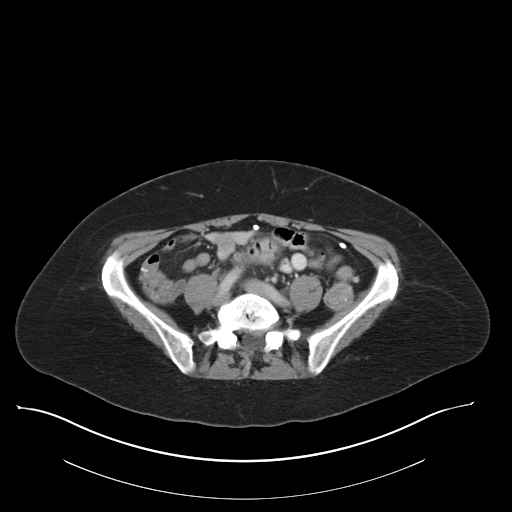
[im 50/100  soft-tissue]
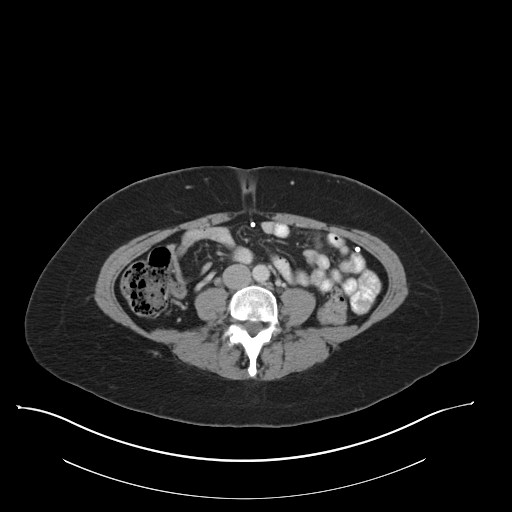
[im 58/100  soft-tissue]
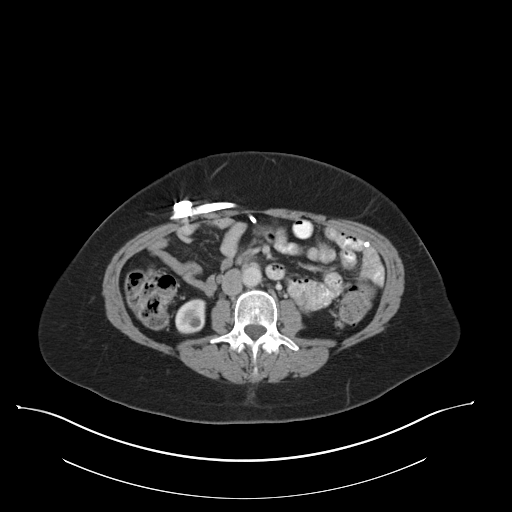
[im 67/100  soft-tissue]
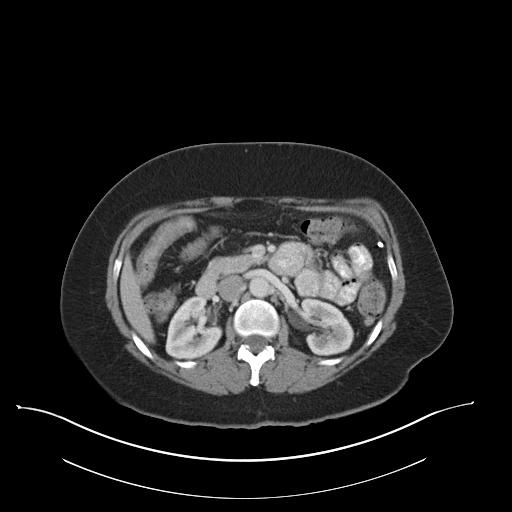
[im 67/100  bone]
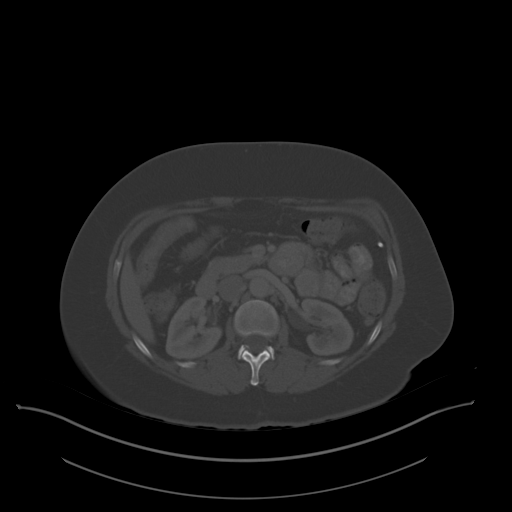
[im 71/100  soft-tissue]
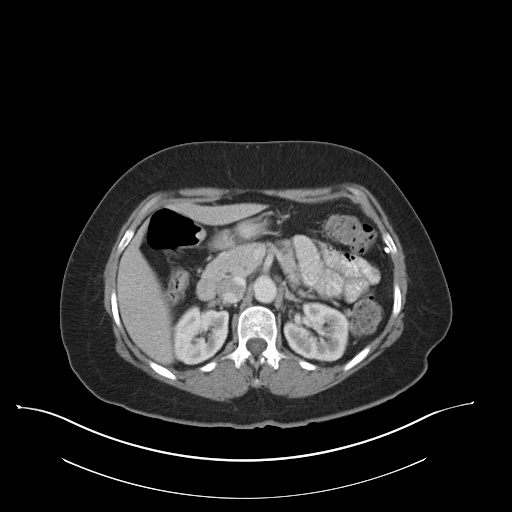
[im 79/100  soft-tissue]
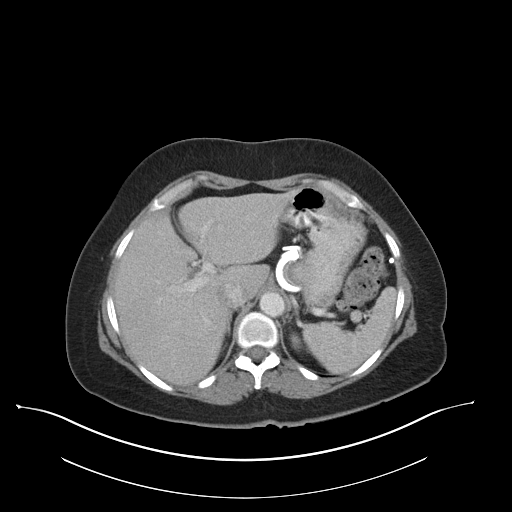
[im 87/100  soft-tissue]
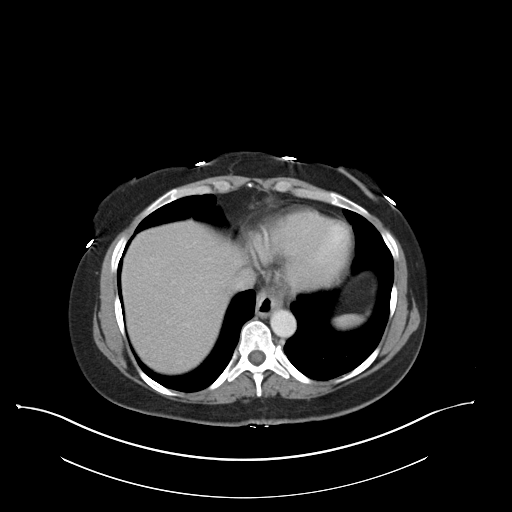
[im 95/100  soft-tissue]
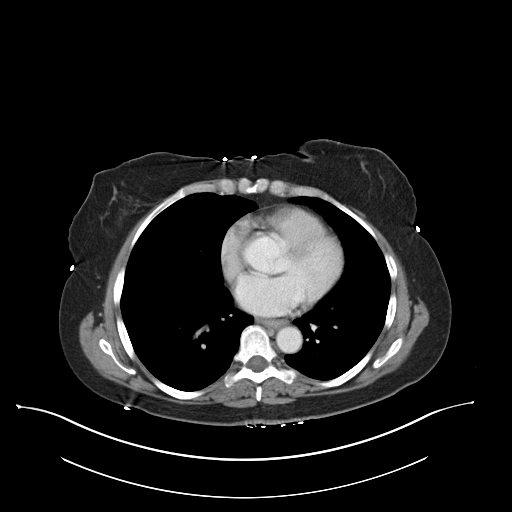

[Series 3: coronal a/|p · coronal · 0.93mm/px · 3 of 140 slices shown]
[im 47/140  soft-tissue]
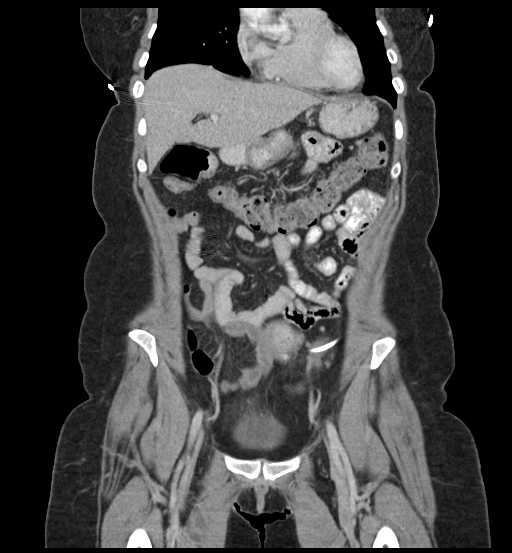
[im 62/140  soft-tissue]
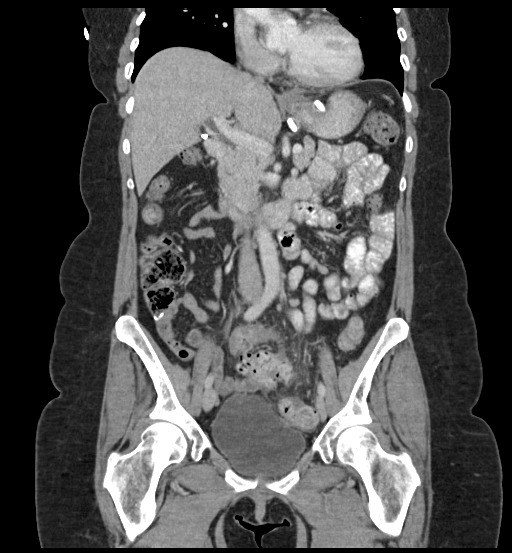
[im 78/140  soft-tissue]
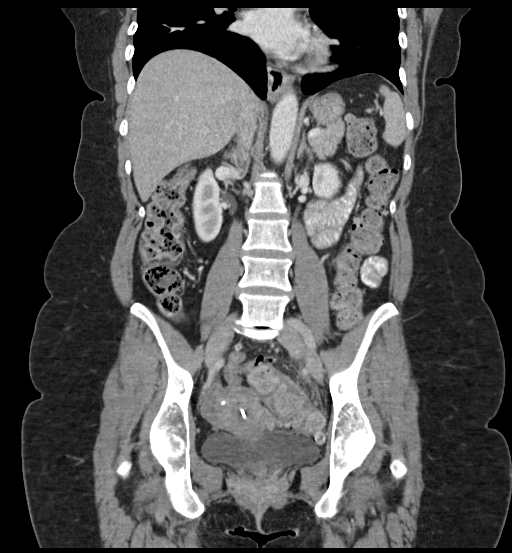

[16 of 46 positions shown; findings below may reference images not displayed]

FINDINGS: Lower chest: No acute findings.

Hepatobiliary: No focal liver abnormality is seen. Status post
cholecystectomy. No biliary dilatation.

Pancreas: Unremarkable. No pancreatic ductal dilatation or
surrounding inflammatory changes.

Spleen: Normal in size without focal abnormality.

Adrenals/Urinary Tract: Normal adrenal glands. Two 2-3 mm stones in
the upper pole the left kidney, nonobstructing. Otherwise, left
kidney normal. Right kidney within normal limits. The ureters normal
in caliber bilaterally. Normal urinary bladder.

Stomach/Bowel: Gastric lap band in stable and satisfactory position,
with port in the subcutaneous tissues of the right anterior
abdominal wall. The tubing is intact. Stomach within normal limits.
Small bowel loops normal in caliber and wall thickness.

There is extensive diverticulosis involving the sigmoid colon. There
is stranding in the adjacent sigmoid mesenteric in the region of the
diverticulosis. No extraluminal gas or abscess is identified.
Negative for free intraperitoneal air. There are scattered
uncomplicated diverticula involving the descending colon. No
significant/definite free fluid in the pelvis.

Vascular/Lymphatic: There a few non pathologically enlarged
retroperitoneal lymph nodes. No lymphadenopathy. Abdominal aorta
normal in caliber and appearance. Iliac and proximal femoral
arteries normal.

Reproductive: Intrauterine device appears appropriately positioned.
Uterus within normal limits for CT appearances. No adnexal mass
identified.

Other: No abdominal wall hernia or abnormality. No abdominopelvic
ascites.

Musculoskeletal: Degenerative disc disease L5-S1.
IMPRESSION: Acute sigmoid colon diverticulitis. Negative for abscess or free
air.

Two small nonobstructing left renal stones.

Gastric lap band in stable and satisfactory position.

## 2018-08-13 DIAGNOSIS — Z9889 Other specified postprocedural states: Secondary | ICD-10-CM | POA: Diagnosis not present

## 2018-08-13 DIAGNOSIS — Z8719 Personal history of other diseases of the digestive system: Secondary | ICD-10-CM | POA: Diagnosis not present

## 2018-08-13 DIAGNOSIS — R1011 Right upper quadrant pain: Secondary | ICD-10-CM | POA: Diagnosis not present

## 2018-08-13 DIAGNOSIS — K8689 Other specified diseases of pancreas: Secondary | ICD-10-CM | POA: Diagnosis not present

## 2018-08-13 DIAGNOSIS — G8918 Other acute postprocedural pain: Secondary | ICD-10-CM | POA: Diagnosis not present

## 2018-08-13 DIAGNOSIS — Z48815 Encounter for surgical aftercare following surgery on the digestive system: Secondary | ICD-10-CM | POA: Diagnosis not present

## 2018-08-13 DIAGNOSIS — K432 Incisional hernia without obstruction or gangrene: Secondary | ICD-10-CM | POA: Diagnosis not present

## 2018-08-21 DIAGNOSIS — Z9889 Other specified postprocedural states: Secondary | ICD-10-CM | POA: Diagnosis not present

## 2018-08-21 DIAGNOSIS — Z9049 Acquired absence of other specified parts of digestive tract: Secondary | ICD-10-CM | POA: Diagnosis not present

## 2018-08-21 DIAGNOSIS — R1031 Right lower quadrant pain: Secondary | ICD-10-CM | POA: Diagnosis not present

## 2018-08-21 DIAGNOSIS — K869 Disease of pancreas, unspecified: Secondary | ICD-10-CM | POA: Diagnosis not present

## 2018-09-03 DIAGNOSIS — K869 Disease of pancreas, unspecified: Secondary | ICD-10-CM | POA: Diagnosis not present

## 2018-09-03 DIAGNOSIS — K8689 Other specified diseases of pancreas: Secondary | ICD-10-CM | POA: Diagnosis not present

## 2018-10-05 DIAGNOSIS — R1031 Right lower quadrant pain: Secondary | ICD-10-CM | POA: Diagnosis not present

## 2018-11-09 DIAGNOSIS — R1030 Lower abdominal pain, unspecified: Secondary | ICD-10-CM | POA: Diagnosis not present

## 2018-11-25 DIAGNOSIS — M25531 Pain in right wrist: Secondary | ICD-10-CM | POA: Diagnosis not present

## 2019-02-08 DIAGNOSIS — Z01419 Encounter for gynecological examination (general) (routine) without abnormal findings: Secondary | ICD-10-CM | POA: Diagnosis not present

## 2019-02-08 DIAGNOSIS — N951 Menopausal and female climacteric states: Secondary | ICD-10-CM | POA: Diagnosis not present

## 2019-04-30 DIAGNOSIS — M9903 Segmental and somatic dysfunction of lumbar region: Secondary | ICD-10-CM | POA: Diagnosis not present

## 2019-04-30 DIAGNOSIS — M5117 Intervertebral disc disorders with radiculopathy, lumbosacral region: Secondary | ICD-10-CM | POA: Diagnosis not present

## 2019-04-30 DIAGNOSIS — M9905 Segmental and somatic dysfunction of pelvic region: Secondary | ICD-10-CM | POA: Diagnosis not present

## 2019-04-30 DIAGNOSIS — M62830 Muscle spasm of back: Secondary | ICD-10-CM | POA: Diagnosis not present

## 2019-04-30 DIAGNOSIS — S338XXA Sprain of other parts of lumbar spine and pelvis, initial encounter: Secondary | ICD-10-CM | POA: Diagnosis not present

## 2019-04-30 DIAGNOSIS — M9904 Segmental and somatic dysfunction of sacral region: Secondary | ICD-10-CM | POA: Diagnosis not present

## 2019-04-30 DIAGNOSIS — M9906 Segmental and somatic dysfunction of lower extremity: Secondary | ICD-10-CM | POA: Diagnosis not present

## 2019-08-20 DIAGNOSIS — Z1231 Encounter for screening mammogram for malignant neoplasm of breast: Secondary | ICD-10-CM | POA: Diagnosis not present

## 2019-08-20 DIAGNOSIS — Z803 Family history of malignant neoplasm of breast: Secondary | ICD-10-CM | POA: Diagnosis not present

## 2019-10-27 DIAGNOSIS — M79671 Pain in right foot: Secondary | ICD-10-CM | POA: Diagnosis not present

## 2019-10-27 DIAGNOSIS — M25561 Pain in right knee: Secondary | ICD-10-CM | POA: Diagnosis not present

## 2020-01-10 DIAGNOSIS — B349 Viral infection, unspecified: Secondary | ICD-10-CM | POA: Diagnosis not present

## 2020-01-10 DIAGNOSIS — U071 COVID-19: Secondary | ICD-10-CM | POA: Diagnosis not present

## 2020-01-10 DIAGNOSIS — Z20828 Contact with and (suspected) exposure to other viral communicable diseases: Secondary | ICD-10-CM | POA: Diagnosis not present

## 2020-01-16 DIAGNOSIS — U071 COVID-19: Secondary | ICD-10-CM | POA: Diagnosis not present

## 2020-01-16 DIAGNOSIS — R509 Fever, unspecified: Secondary | ICD-10-CM | POA: Diagnosis not present

## 2020-01-16 DIAGNOSIS — R5383 Other fatigue: Secondary | ICD-10-CM | POA: Diagnosis not present

## 2020-02-03 ENCOUNTER — Ambulatory Visit
Admission: RE | Admit: 2020-02-03 | Discharge: 2020-02-03 | Disposition: A | Discharge status: Home / Self Care | Payer: BLUE CROSS/BLUE SHIELD | Source: Ambulatory Visit | Attending: Family Medicine | Admitting: Family Medicine

## 2020-02-03 ENCOUNTER — Other Ambulatory Visit: Payer: Self-pay

## 2020-02-03 ENCOUNTER — Other Ambulatory Visit: Payer: Self-pay | Admitting: Family Medicine

## 2020-02-03 DIAGNOSIS — R0789 Other chest pain: Secondary | ICD-10-CM

## 2020-02-03 DIAGNOSIS — R079 Chest pain, unspecified: Secondary | ICD-10-CM | POA: Diagnosis not present

## 2020-03-14 DIAGNOSIS — N951 Menopausal and female climacteric states: Secondary | ICD-10-CM | POA: Diagnosis not present

## 2020-03-14 DIAGNOSIS — Z01419 Encounter for gynecological examination (general) (routine) without abnormal findings: Secondary | ICD-10-CM | POA: Diagnosis not present

## 2020-03-14 DIAGNOSIS — R319 Hematuria, unspecified: Secondary | ICD-10-CM | POA: Diagnosis not present

## 2020-03-14 DIAGNOSIS — Z6829 Body mass index (BMI) 29.0-29.9, adult: Secondary | ICD-10-CM | POA: Diagnosis not present

## 2020-04-27 DIAGNOSIS — M542 Cervicalgia: Secondary | ICD-10-CM | POA: Diagnosis not present

## 2020-04-27 DIAGNOSIS — M546 Pain in thoracic spine: Secondary | ICD-10-CM | POA: Diagnosis not present

## 2020-04-27 DIAGNOSIS — M9901 Segmental and somatic dysfunction of cervical region: Secondary | ICD-10-CM | POA: Diagnosis not present

## 2020-04-27 DIAGNOSIS — M9902 Segmental and somatic dysfunction of thoracic region: Secondary | ICD-10-CM | POA: Diagnosis not present

## 2020-05-01 DIAGNOSIS — M9902 Segmental and somatic dysfunction of thoracic region: Secondary | ICD-10-CM | POA: Diagnosis not present

## 2020-05-01 DIAGNOSIS — M542 Cervicalgia: Secondary | ICD-10-CM | POA: Diagnosis not present

## 2020-05-01 DIAGNOSIS — M546 Pain in thoracic spine: Secondary | ICD-10-CM | POA: Diagnosis not present

## 2020-05-01 DIAGNOSIS — M9901 Segmental and somatic dysfunction of cervical region: Secondary | ICD-10-CM | POA: Diagnosis not present

## 2020-05-13 DIAGNOSIS — L820 Inflamed seborrheic keratosis: Secondary | ICD-10-CM | POA: Diagnosis not present

## 2020-06-09 DIAGNOSIS — M79674 Pain in right toe(s): Secondary | ICD-10-CM | POA: Diagnosis not present

## 2020-08-25 DIAGNOSIS — Z1231 Encounter for screening mammogram for malignant neoplasm of breast: Secondary | ICD-10-CM | POA: Diagnosis not present

## 2020-10-03 DIAGNOSIS — L600 Ingrowing nail: Secondary | ICD-10-CM | POA: Diagnosis not present

## 2020-10-03 DIAGNOSIS — M79674 Pain in right toe(s): Secondary | ICD-10-CM | POA: Diagnosis not present

## 2020-10-06 ENCOUNTER — Ambulatory Visit: Payer: BC Managed Care – PPO | Admitting: Sports Medicine

## 2020-10-24 DIAGNOSIS — L600 Ingrowing nail: Secondary | ICD-10-CM | POA: Diagnosis not present

## 2021-03-22 DIAGNOSIS — M79671 Pain in right foot: Secondary | ICD-10-CM | POA: Diagnosis not present

## 2021-04-11 DIAGNOSIS — Z01419 Encounter for gynecological examination (general) (routine) without abnormal findings: Secondary | ICD-10-CM | POA: Diagnosis not present

## 2021-04-11 DIAGNOSIS — Z1329 Encounter for screening for other suspected endocrine disorder: Secondary | ICD-10-CM | POA: Diagnosis not present

## 2021-04-11 DIAGNOSIS — Z6825 Body mass index (BMI) 25.0-25.9, adult: Secondary | ICD-10-CM | POA: Diagnosis not present

## 2021-04-11 DIAGNOSIS — R319 Hematuria, unspecified: Secondary | ICD-10-CM | POA: Diagnosis not present

## 2021-04-11 DIAGNOSIS — Z30432 Encounter for removal of intrauterine contraceptive device: Secondary | ICD-10-CM | POA: Diagnosis not present

## 2021-04-11 DIAGNOSIS — Z131 Encounter for screening for diabetes mellitus: Secondary | ICD-10-CM | POA: Diagnosis not present

## 2021-04-11 DIAGNOSIS — Z1321 Encounter for screening for nutritional disorder: Secondary | ICD-10-CM | POA: Diagnosis not present

## 2021-04-11 DIAGNOSIS — Z130 Encounter for screening for diseases of the blood and blood-forming organs and certain disorders involving the immune mechanism: Secondary | ICD-10-CM | POA: Diagnosis not present

## 2021-07-13 DIAGNOSIS — M79671 Pain in right foot: Secondary | ICD-10-CM | POA: Diagnosis not present

## 2021-08-27 DIAGNOSIS — Z1231 Encounter for screening mammogram for malignant neoplasm of breast: Secondary | ICD-10-CM | POA: Diagnosis not present

## 2021-09-11 DIAGNOSIS — Z4651 Encounter for fitting and adjustment of gastric lap band: Secondary | ICD-10-CM | POA: Diagnosis not present

## 2021-09-17 DIAGNOSIS — G43809 Other migraine, not intractable, without status migrainosus: Secondary | ICD-10-CM | POA: Diagnosis not present

## 2021-09-21 DIAGNOSIS — M79674 Pain in right toe(s): Secondary | ICD-10-CM | POA: Diagnosis not present

## 2021-11-22 DIAGNOSIS — L918 Other hypertrophic disorders of the skin: Secondary | ICD-10-CM | POA: Diagnosis not present

## 2021-12-13 DIAGNOSIS — D3132 Benign neoplasm of left choroid: Secondary | ICD-10-CM | POA: Diagnosis not present

## 2021-12-13 DIAGNOSIS — H2512 Age-related nuclear cataract, left eye: Secondary | ICD-10-CM | POA: Diagnosis not present

## 2021-12-13 DIAGNOSIS — H35362 Drusen (degenerative) of macula, left eye: Secondary | ICD-10-CM | POA: Diagnosis not present

## 2021-12-13 DIAGNOSIS — H2511 Age-related nuclear cataract, right eye: Secondary | ICD-10-CM | POA: Diagnosis not present

## 2021-12-24 DIAGNOSIS — M2021 Hallux rigidus, right foot: Secondary | ICD-10-CM | POA: Diagnosis not present

## 2021-12-28 DIAGNOSIS — H2512 Age-related nuclear cataract, left eye: Secondary | ICD-10-CM | POA: Diagnosis not present

## 2021-12-28 DIAGNOSIS — H2513 Age-related nuclear cataract, bilateral: Secondary | ICD-10-CM | POA: Diagnosis not present

## 2022-01-22 DIAGNOSIS — M2021 Hallux rigidus, right foot: Secondary | ICD-10-CM | POA: Diagnosis not present

## 2022-01-22 DIAGNOSIS — G8918 Other acute postprocedural pain: Secondary | ICD-10-CM | POA: Diagnosis not present

## 2022-02-14 DIAGNOSIS — H2512 Age-related nuclear cataract, left eye: Secondary | ICD-10-CM | POA: Diagnosis not present

## 2022-04-29 DIAGNOSIS — M25871 Other specified joint disorders, right ankle and foot: Secondary | ICD-10-CM | POA: Diagnosis not present

## 2022-05-16 DIAGNOSIS — M79671 Pain in right foot: Secondary | ICD-10-CM | POA: Diagnosis not present

## 2022-05-22 DIAGNOSIS — M79674 Pain in right toe(s): Secondary | ICD-10-CM | POA: Diagnosis not present

## 2022-05-23 DIAGNOSIS — M79671 Pain in right foot: Secondary | ICD-10-CM | POA: Diagnosis not present

## 2022-05-27 ENCOUNTER — Other Ambulatory Visit: Payer: Self-pay | Admitting: Orthopaedic Surgery

## 2022-05-27 DIAGNOSIS — M79671 Pain in right foot: Secondary | ICD-10-CM

## 2022-05-30 DIAGNOSIS — N390 Urinary tract infection, site not specified: Secondary | ICD-10-CM | POA: Diagnosis not present

## 2022-06-03 ENCOUNTER — Ambulatory Visit
Admission: RE | Admit: 2022-06-03 | Discharge: 2022-06-03 | Disposition: A | Discharge status: Home / Self Care | Payer: BC Managed Care – PPO | Source: Ambulatory Visit | Attending: Orthopaedic Surgery | Admitting: Orthopaedic Surgery

## 2022-06-03 DIAGNOSIS — M7989 Other specified soft tissue disorders: Secondary | ICD-10-CM | POA: Diagnosis not present

## 2022-06-03 DIAGNOSIS — M79671 Pain in right foot: Secondary | ICD-10-CM

## 2022-06-05 DIAGNOSIS — M79674 Pain in right toe(s): Secondary | ICD-10-CM | POA: Diagnosis not present

## 2022-06-06 ENCOUNTER — Encounter (INDEPENDENT_AMBULATORY_CARE_PROVIDER_SITE_OTHER): Payer: Self-pay | Admitting: Ophthalmology

## 2022-06-06 ENCOUNTER — Ambulatory Visit (INDEPENDENT_AMBULATORY_CARE_PROVIDER_SITE_OTHER): Payer: BC Managed Care – PPO | Admitting: Ophthalmology

## 2022-06-06 ENCOUNTER — Encounter (INDEPENDENT_AMBULATORY_CARE_PROVIDER_SITE_OTHER): Payer: Self-pay

## 2022-06-06 DIAGNOSIS — H2511 Age-related nuclear cataract, right eye: Secondary | ICD-10-CM | POA: Diagnosis not present

## 2022-06-06 DIAGNOSIS — H33012 Retinal detachment with single break, left eye: Secondary | ICD-10-CM | POA: Insufficient documentation

## 2022-06-06 DIAGNOSIS — Z961 Presence of intraocular lens: Secondary | ICD-10-CM | POA: Diagnosis not present

## 2022-06-06 MED ORDER — OFLOXACIN 0.3 % OP SOLN: 1.0000 [drp] | OPHTHALMIC | 0 refills | Status: AC

## 2022-06-06 MED ORDER — PREDNISOLONE ACETATE 1 % OP SUSP: 1.0000 [drp] | Freq: Four times a day (QID) | OPHTHALMIC | 0 refills | Status: DC

## 2022-06-06 NOTE — Assessment & Plan Note (Signed)
Mild OD

## 2022-06-06 NOTE — Assessment & Plan Note (Addendum)
Largely inferior bullous rhegmatogenous retinal detachment.  No breaks in the horizontal meridian.  Small atrophic breaks seen at 6:00 meridian near the mid periphery.  Will likely need a combination repair via scleral buckle with vitrectomy installation of gas endolaser photocoagulation of the left eye.  Intravitreal tamponade likely with long-acting gas will also be required  Guttering of the peripheral retina and and detachment extending superotemporally to the 1 o'clock position with no identifiable break here in the clinic setting.  I explained the patient that we will attempt EUA with pneumatic retinopexy if the retinal break can be clearly identified.  This would involve a 5 to 10-minute procedure in the operating room tomorrow.  If this cannot be identified the longer 1-1/2-hour to 2-hour procedure of doing a scleral buckle cryopexy with vitrectomy and gas injection would be necessary.  She understands the issues.  90 to 92% success rate with 1  surgery.  I explained the outcome of potential scarring that could necessitate another surgery.

## 2022-06-06 NOTE — Patient Instructions (Addendum)
N.p.o. after midnight tonight.  May take medication with small sips of water in the morning otherwise n.p.o. in the morning  Preoperative medications patient to acquire ofloxacin and prednisolone acetate today and at least have 1 drop of each in the left eye twice tonight prior to bed as well as once in the morning.

## 2022-06-06 NOTE — Assessment & Plan Note (Signed)
March 2023, Dr. Misty Stanley sun cataract surgery lens implant centrally located

## 2022-06-06 NOTE — Progress Notes (Signed)
06/06/2022     CHIEF COMPLAINT Patient presents for  Chief Complaint  Patient presents with   Retina Evaluation      HISTORY OF PRESENT ILLNESS: Veronica Bailey is a 56 y.o. female who presents to the clinic today for:   HPI     Retina Evaluation           Laterality: left eye   Associated Symptoms: Floaters.  Negative for Distortion, Blind Spot, Redness, Photophobia, Glare, Trauma, Scalp Tenderness, Jaw Claudication, Shoulder/Hip pain, Fever, Weight Loss and Fatigue         Comments   ENP- Possible ret detachment OS- Ref by Gwynn Burly. "On Tuesday, things got a little worse. There is a line that's right down the middle and its getting worse and it is impeding my vision. I saw floaters at first but ive always seen floaters before then so i didn't think anything of it. I don't remember if ive seen a flash of light. It must've been one time and instantaneously." Pt had cataract surgery on the left eye in March performed by Dr. Nancy Fetter        Last edited by Silvestre Moment on 06/06/2022  1:49 PM.      Referring physician: Shirline Frees, MD [place],  [place] [address]  HISTORICAL INFORMATION:   Selected notes from the [facility]: No current outpatient medications on file. (Ophthalmic Drugs)   No current facility-administered medications for this visit. (Ophthalmic Drugs)   Current Outpatient Medications (Other)  Medication Sig   buPROPion (WELLBUTRIN XL) 300 MG 24 hr tablet Take 300 mg by mouth daily.   ciprofloxacin (CIPRO) 500 MG tablet Take 1 tablet (500 mg total) by mouth 2 (two) times daily. One po bid x 7 days   clindamycin (CLEOCIN) 300 MG capsule Take 1 capsule (300 mg total) by mouth every 6 (six) hours.   FIBER PO Take 1 tablet by mouth at bedtime.   HYDROcodone-acetaminophen (NORCO/VICODIN) 5-325 MG tablet Take 1-2 tablets by mouth every 6 (six) hours as needed.   levonorgestrel (MIRENA) 20 MCG/24HR  IUD 1 each by Intrauterine route once.   magnesium gluconate (MAGONATE) 500 MG tablet Take 500 mg by mouth every evening.   metroNIDAZOLE (FLAGYL) 500 MG tablet Take 1 tablet (500 mg total) by mouth 3 (three) times daily. One po tid x 7 days   ondansetron (ZOFRAN ODT) 4 MG disintegrating tablet Take 1 tablet (4 mg total) by mouth every 8 (eight) hours as needed for nausea.   rizatriptan (MAXALT) 10 MG tablet Take 10 mg by mouth daily as needed for migraine. May repeat in 2 hours if needed   zolpidem (AMBIEN) 10 MG tablet Take 5 mg by mouth at bedtime.    No current facility-administered medications for this visit. (Other)      REVIEW OF SYSTEMS: ROS   Negative for: Constitutional, Gastrointestinal, Neurological, Skin, Genitourinary, Musculoskeletal, HENT, Endocrine, Cardiovascular, Eyes, Respiratory, Psychiatric, Allergic/Imm, Heme/Lymph Last edited by Silvestre Moment on 06/06/2022  1:49 PM.       ALLERGIES Allergies  Allergen Reactions   Benadryl [Diphenhydramine Hcl] Swelling    Swelling of throat   Imitrex [Sumatriptan] Other (See Comments)    palpations    PAST MEDICAL HISTORY Past Medical History:  Diagnosis Date   Diverticulitis    Migraine    Past Surgical History:  Procedure Laterality Date   APPENDECTOMY     BACK  SURGERY     CHOLECYSTECTOMY     LAPAROSCOPIC GASTRIC BANDING     LAPAROSCOPIC REVISION OF GASTRIC BAND      FAMILY HISTORY History reviewed. No pertinent family history.  SOCIAL HISTORY Social History   Tobacco Use   Smoking status: Never   Smokeless tobacco: Never  Substance Use Topics   Alcohol use: No   Drug use: No         OPHTHALMIC EXAM:  Base Eye Exam     Visual Acuity (ETDRS)       Right Left   Dist [place] 20/25 -1 20/400   Dist ph [place]  20/200         Tonometry (Tonopen, 1:53 PM)       Right Left   Pressure 21 19         Pupils       Pupils Dark Light Shape React APD   Right PERRL 5 5 Round  None   Left PERRL 4 3 Round  Brisk None  Eyes were dilated previously in the morning from another office.        Dilation     Left eye: 2.5% Phenylephrine, 1.0% Mydriacyl @ 1:53 PM           Slit Lamp and Fundus Exam     Slit Lamp Exam       Right Left   Lids/Lashes Normal Normal   Conjunctiva/Sclera White and quiet White and quiet   Cornea Clear Clear   Anterior Chamber Deep and quiet Deep and quiet   Iris Round and reactive Round and reactive   Lens 1+ Nuclear sclerosis Centered posterior chamber intraocular lens   Anterior Vitreous Normal Normal         Fundus Exam       Right Left   Posterior Vitreous Normal Normal   Disc Normal Normal   C/D Ratio 0.25 0.3   Macula Normal Normal   Vessels Normal Normal   Periphery Normal Rhegmatogenous retinal detachment, inferior, macula off pseudophakic conditionNo breaks seen on scleral depression with 25, with 28, and with 20 diopter examination as well as 90 diopter peripheral examination some view is hampered by the peripheral capsular opacification.  Potential detachment is shallow guttering from the 1 o'clock position superotemporally and temporally and inferiorly down around to the 9 o'clock position macula off detachment            IMAGING AND PROCEDURES  Imaging and Procedures for 06/06/22  OCT, Retina - OU - Both Eyes       Right Eye Central Foveal Thickness: 285. Progression has no prior data. Findings include normal foveal contour.   Left Eye Central Foveal Thickness: 806. Progression has no prior data.   Notes Macular detachment OS      Color Fundus Photography Optos - OU - Both Eyes       Macula off rhegmatogenous retinal detachment, pseudophakic.  Obvious guttering extends up to 1:00.     B-Scan Ultrasound - OS - Left Eye       Quality was good. Findings included extensive retinal detachment.   Notes Macula off retinal detachment left eye.,  Guttering extends to 1:00 superotemporally and then inferiorly.  No giant  tears.             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  No breaks in the horizontal meridian.  Small atrophic breaks seen at 6:00  meridian near the mid periphery.  Will likely need a combination repair via scleral buckle with vitrectomy installation of gas endolaser photocoagulation of the left eye.  Intravitreal tamponade likely with long-acting gas will also be required  Guttering of the peripheral retina and and detachment extending superotemporally to the 1 o'clock position with no identifiable break here in the clinic setting.  I explained the patient that we will attempt EUA with pneumatic retinopexy if the retinal break can be clearly identified.  This would involve a 5 to 10-minute procedure in the operating room tomorrow.  If this cannot be identified the longer 1-1/2-hour to 2-hour procedure of doing a scleral buckle cryopexy with vitrectomy and gas injection would be necessary.  She understands the issues.  90 to 92% success rate with 1  surgery.  I explained the outcome of potential scarring that could necessitate another surgery.    Nuclear sclerotic cataract of right eye Mild OD  Pseudophakia of left eye March 2023, Dr. Lattie Haw sun cataract surgery lens implant centrally located      ICD-10-CM   1. 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 Nuclear sclerotic cataract of right eye  H25.11     3. Pseudophakia of left eye  Z96.1       1.  OS, will attempt to perform pneumatic retinopexy after EUA under general anesthesia tomorrow if the eye causative break can be identified however if not we will proceed with at that time vitrectomy combined with scleral buckle with a band and likely gas  injection.  2.  3.  Ophthalmic Meds Ordered this visit:  No orders of the defined types were placed in this encounter.      Return General via [facility], for Schedule EUA, possible pneumatic retinopexy, and if not Vit buckle left eye.  Patient Instructions  N.p.o. after midnight tonight.  May take medication with small sips of water in the morning otherwise n.p.o. in the morning  Preoperative medications patient to acquire ofloxacin and prednisolone acetate today and at least have 1 drop of each in the left eye twice tonight prior to bed as well as once in the morning.  Explained the diagnoses, plan, and follow up with the patient and they expressed understanding.  Patient expressed understanding of the importance of proper follow up care.   Clent Demark Denessa Cavan M.D. Diseases & Surgery of the Retina and Vitreous [facility] [place] 06/06/22     Abbreviations: M myopia (nearsighted); A astigmatism; H hyperopia (farsighted); P presbyopia; Mrx spectacle prescription;  CTL contact lenses; OD right eye; OS left eye; OU both eyes  XT exotropia; ET esotropia; PEK punctate epithelial keratitis; PEE punctate epithelial erosions; DES dry eye syndrome; MGD meibomian gland dysfunction; ATs artificial tears; PFAT's preservative free artificial tears; [place] nuclear sclerotic cataract; PSC posterior subcapsular cataract; ERM epi-retinal membrane; PVD posterior vitreous detachment; RD retinal detachment; DM diabetes mellitus; DR diabetic retinopathy; NPDR non-proliferative diabetic retinopathy; PDR proliferative diabetic retinopathy; CSME clinically significant macular edema; DME diabetic macular edema; dbh dot blot hemorrhages; CWS cotton wool spot; POAG primary open angle glaucoma; C/D cup-to-disc ratio; HVF humphrey visual field; GVF goldmann visual field; OCT optical coherence tomography; IOP intraocular pressure; BRVO Branch retinal vein occlusion; CRVO central retinal vein occlusion; CRAO central  retinal  artery occlusion; BRAO branch retinal artery occlusion; RT retinal tear; SB scleral buckle; PPV pars plana vitrectomy; VH Vitreous hemorrhage; PRP panretinal laser photocoagulation; IVK intravitreal kenalog; VMT vitreomacular traction; MH Macular hole;  NVD neovascularization of the disc; NVE neovascularization elsewhere; AREDS age related eye disease study; ARMD age related macular degeneration; POAG primary open angle glaucoma; EBMD epithelial/anterior basement membrane dystrophy; ACIOL anterior chamber intraocular lens; IOL intraocular lens; PCIOL posterior chamber intraocular lens; Phaco/IOL phacoemulsification with intraocular lens placement; [place] photorefractive keratectomy; LASIK laser assisted in situ keratomileusis; HTN hypertension; DM diabetes mellitus; COPD chronic obstructive pulmonary disease

## 2022-06-07 ENCOUNTER — Encounter (AMBULATORY_SURGERY_CENTER): Payer: BC Managed Care – PPO | Admitting: Ophthalmology

## 2022-06-07 DIAGNOSIS — H33022 Retinal detachment with multiple breaks, left eye: Secondary | ICD-10-CM | POA: Diagnosis not present

## 2022-06-08 ENCOUNTER — Ambulatory Visit (INDEPENDENT_AMBULATORY_CARE_PROVIDER_SITE_OTHER): Payer: BC Managed Care – PPO | Admitting: Ophthalmology

## 2022-06-08 ENCOUNTER — Encounter (INDEPENDENT_AMBULATORY_CARE_PROVIDER_SITE_OTHER): Payer: Self-pay | Admitting: Ophthalmology

## 2022-06-08 DIAGNOSIS — H33012 Retinal detachment with single break, left eye: Secondary | ICD-10-CM

## 2022-06-08 NOTE — Assessment & Plan Note (Signed)
Postop day #1 looks great today Groat high buckle inferiorly for large rhegmatogenous detachment with support temporally for retinal breaks found atrophic hole at 330 o'clock position as well as 6 o'clock position inferiorly  Macula flat retina attached good retinopexy, temporally good cryopexy,and  superotemporally

## 2022-06-08 NOTE — Patient Instructions (Addendum)
Ofloxacin  4 times daily to the operative eye  Prednisolone acetate 1 drop to the operative eye 4 times daily  Patient instructed not to refill the medications and use them for maximum of 3 weeks.  Patient instructed do not rub the eye.  Patient has the option to use the patch at night.   The patient was found to be doing well, postoperatively, and was advised in the use of drops and home care.  Patient was advised not to rub eyes. Positioning was described as well, as precautions regarding intravitreal gas, if applicable. DO NOT TRAVEL TO [place], OR IN AIRPLANE, UNTIL THE BUBBLE INSIDE THE EYE HAS DISAPPEARED.  The use of eye patch at night is optional and was discussed. May use paper tape with patch if patient has dry skin and transpore tape for oily skin.   Use topical medications as ordered.   For next 2 days patient instructed to intensively while awake look face down so as to prevent recurrence of subretinal fluid in the macular region.  Sleep or lie on either side preferably right side down if possible.  Acetaminophen 500 mg (Tylenol Extra Strength) 2 tablets every 8-12 hours round-the-clock for the next 3 days and try to extend the interval from 8 hours to 10 hours and then subsequently to 12 hours  To use as needed  May also use ibuprofen 400 mg (2 tabs) or 600 mg (3 tabs) every 8 hours as well 2 hours after the or more after the last dose of acetaminophen is taken so as to maximize discomfort control  Optional use of hard eyepatch left eye nightly   patient to call Monday for follow-up appointment on June 13, 2022, Thursday

## 2022-06-08 NOTE — Progress Notes (Signed)
06/08/2022     CHIEF COMPLAINT Patient presents for  Chief Complaint  Patient presents with   Post-op Follow-up    Postop day #1 repair pseudophakic macula off rhegmatogenous retinal detachment OS.  Repair via vitrectomy, scleral buckle, cryopexy, internal drainage subretinal fluid, temporary use of Perfluoron, endodiathermy breaks, breaks at 330, and 6 o'clock position inferiorly, and injection C3F8 8% gas  HISTORY OF PRESENT ILLNESS: Veronica Bailey is a 56 y.o. female who presents to the clinic today for:   HPI     Post-op Follow-up           Laterality: left eye   Discomfort: pain and tearing   Vision: is blurred at distance         Comments   Postop day #1, status post repair complex retinal detachment left eye, via vitrectomy, scleral buckle, retinal cryopexy, internal drainage of subretinal fluid, endodiathermy, injection C3F8 8% gas.  Moderate discomfort last night.  Controlled decently with combination of acetaminophen as well as oral Ibuprofen ibuprofen  Mild to moderate discomfort OS, with significant tearing.      Last edited by Edmon Crape, MD on 06/08/2022  9:02 AM.      Referring physician: No referring provider defined for this encounter.  HISTORICAL INFORMATION:   Selected notes from the MEDICAL RECORD NUMBER       CURRENT MEDICATIONS: Current Outpatient Medications (Ophthalmic Drugs)  Medication Sig   ofloxacin (OCUFLOX) 0.3 % ophthalmic solution Place 1 drop into the left eye every 4 (four) hours for 10 days.   prednisoLONE acetate (PRED FORTE) 1 % ophthalmic suspension Place 1 drop into the left eye 4 (four) times daily.   No current facility-administered medications for this visit. (Ophthalmic Drugs)   Current Outpatient Medications (Other)  Medication Sig   buPROPion (WELLBUTRIN XL) 300 MG 24 hr tablet Take 300 mg by mouth daily.   ciprofloxacin (CIPRO) 500 MG tablet Take 1 tablet (500 mg total) by mouth 2 (two) times daily. One po bid  x 7 days   clindamycin (CLEOCIN) 300 MG capsule Take 1 capsule (300 mg total) by mouth every 6 (six) hours.   FIBER PO Take 1 tablet by mouth at bedtime.   HYDROcodone-acetaminophen (NORCO/VICODIN) 5-325 MG tablet Take 1-2 tablets by mouth every 6 (six) hours as needed.   levonorgestrel (MIRENA) 20 MCG/24HR IUD 1 each by Intrauterine route once.   magnesium gluconate (MAGONATE) 500 MG tablet Take 500 mg by mouth every evening.   metroNIDAZOLE (FLAGYL) 500 MG tablet Take 1 tablet (500 mg total) by mouth 3 (three) times daily. One po tid x 7 days   ondansetron (ZOFRAN ODT) 4 MG disintegrating tablet Take 1 tablet (4 mg total) by mouth every 8 (eight) hours as needed for nausea.   rizatriptan (MAXALT) 10 MG tablet Take 10 mg by mouth daily as needed for migraine. May repeat in 2 hours if needed   zolpidem (AMBIEN) 10 MG tablet Take 5 mg by mouth at bedtime.    No current facility-administered medications for this visit. (Other)      REVIEW OF SYSTEMS: ROS   Negative for: Constitutional, Gastrointestinal, Neurological, Skin, Genitourinary, Musculoskeletal, HENT, Endocrine, Cardiovascular, Eyes, Respiratory, Psychiatric, Allergic/Imm, Heme/Lymph Last edited by Edmon Crape, MD on 06/08/2022  8:41 AM.       ALLERGIES Allergies  Allergen Reactions   Benadryl [Diphenhydramine Hcl] Swelling    Swelling of throat   Imitrex [Sumatriptan] Other (See Comments)    palpations  PAST MEDICAL HISTORY Past Medical History:  Diagnosis Date   Diverticulitis    Migraine    Past Surgical History:  Procedure Laterality Date   APPENDECTOMY     BACK SURGERY     CHOLECYSTECTOMY     LAPAROSCOPIC GASTRIC BANDING     LAPAROSCOPIC REVISION OF GASTRIC BAND      FAMILY HISTORY History reviewed. No pertinent family history.  SOCIAL HISTORY Social History   Tobacco Use   Smoking status: Never   Smokeless tobacco: Never  Substance Use Topics   Alcohol use: No   Drug use: No          OPHTHALMIC EXAM:  Base Eye Exam     Visual Acuity (ETDRS)       Right Left   Dist cc  HM         Tonometry (Tonopen, 8:45 AM)       Right Left   Pressure  21           Slit Lamp and Fundus Exam     External Exam       Right Left   External Normal Normal         Slit Lamp Exam       Right Left   Lids/Lashes  Mild lid edema   Conjunctiva/Sclera  1+ Injection, well closed at limbus   Cornea  Clear   Anterior Chamber  Deep and quiet   Iris  Round and reactive   Lens  Centered posterior chamber intraocular lens   Anterior Vitreous  95% gas         Fundus Exam       Right Left   Posterior Vitreous  95% gas   Disc  Normal   C/D Ratio  0.15   Macula  Macula flat a attached   Vessels  Normal   Periphery  Large buckle, 287 implant inferior 180, with encircling band.  Good cryopexy temporally superotemporally and good laser retinopexy inferiorly and temporally, attached 360            IMAGING AND PROCEDURES  Imaging and Procedures for 06/08/22           ASSESSMENT/PLAN:  New partial retinal detachment of left eye with single defect Postop day #1 looks great today Groat high buckle inferiorly for large rhegmatogenous detachment with support temporally for retinal breaks found atrophic hole at 330 o'clock position as well as 6 o'clock position inferiorly  Macula flat retina attached good retinopexy, temporally good cryopexy,and  superotemporally      ICD-10-CM   1. New partial retinal detachment of left eye with single defect  H33.012       1.  Patient to restart topical medications to the left eye today  2.  Ports wearing green bracelet read regarding the gas internally reviewed with the patient  3.  Postoperative positioning reviewed.   Ophthalmic Meds Ordered this visit:  No orders of the defined types were placed in this encounter.      Return in about 5 days (around 06/13/2022) for OS, POST OP.  Patient Instructions   Ofloxacin  4 times daily to the operative eye  Prednisolone acetate 1 drop to the operative eye 4 times daily  Patient instructed not to refill the medications and use them for maximum of 3 weeks.  Patient instructed do not rub the eye.  Patient has the option to use the patch at night.   The patient was found to be doing well,  postoperatively, and was advised in the use of drops and home care.  Patient was advised not to rub eyes. Positioning was described as well, as precautions regarding intravitreal gas, if applicable. DO NOT TRAVEL TO [place], OR IN AIRPLANE, UNTIL THE BUBBLE INSIDE THE EYE HAS DISAPPEARED.  The use of eye patch at night is optional and was discussed. May use paper tape with patch if patient has dry skin and transpore tape for oily skin.   Use topical medications as ordered.   For next 2 days patient instructed to intensively while awake look face down so as to prevent recurrence of subretinal fluid in the macular region.  Sleep or lie on either side preferably right side down if possible.  Acetaminophen 500 mg (Tylenol Extra Strength) 2 tablets every 8-12 hours round-the-clock for the next 3 days and try to extend the interval from 8 hours to 10 hours and then subsequently to 12 hours  To use as needed  May also use ibuprofen 400 mg (2 tabs) or 600 mg (3 tabs) every 8 hours as well 2 hours after the or more after the last dose of acetaminophen is taken so as to maximize discomfort control  Optional use of hard eyepatch left eye nightly   patient to call Monday for follow-up appointment on June 13, 2022, Thursday  Explained the diagnoses, plan, and follow up with the patient and they expressed understanding.  Patient expressed understanding of the importance of proper follow up care.   Clent Demark Luisenrique Conran M.D. Diseases & Surgery of the Retina and Vitreous [facility] [place] 06/08/22     Abbreviations: M myopia (nearsighted); A astigmatism; H hyperopia  (farsighted); P presbyopia; Mrx spectacle prescription;  CTL contact lenses; OD right eye; OS left eye; OU both eyes  XT exotropia; ET esotropia; PEK punctate epithelial keratitis; PEE punctate epithelial erosions; DES dry eye syndrome; MGD meibomian gland dysfunction; ATs artificial tears; PFAT's preservative free artificial tears; [place] nuclear sclerotic cataract; PSC posterior subcapsular cataract; ERM epi-retinal membrane; PVD posterior vitreous detachment; RD retinal detachment; DM diabetes mellitus; DR diabetic retinopathy; NPDR non-proliferative diabetic retinopathy; PDR proliferative diabetic retinopathy; CSME clinically significant macular edema; DME diabetic macular edema; dbh dot blot hemorrhages; CWS cotton wool spot; POAG primary open angle glaucoma; C/D cup-to-disc ratio; HVF humphrey visual field; GVF goldmann visual field; OCT optical coherence tomography; IOP intraocular pressure; BRVO Branch retinal vein occlusion; CRVO central retinal vein occlusion; CRAO central retinal artery occlusion; BRAO branch retinal artery occlusion; RT retinal tear; SB scleral buckle; PPV pars plana vitrectomy; VH Vitreous hemorrhage; PRP panretinal laser photocoagulation; IVK intravitreal kenalog; VMT vitreomacular traction; MH Macular hole;  NVD neovascularization of the disc; NVE neovascularization elsewhere; AREDS age related eye disease study; ARMD age related macular degeneration; POAG primary open angle glaucoma; EBMD epithelial/anterior basement membrane dystrophy; ACIOL anterior chamber intraocular lens; IOL intraocular lens; PCIOL posterior chamber intraocular lens; Phaco/IOL phacoemulsification with intraocular lens placement; [place] photorefractive keratectomy; LASIK laser assisted in situ keratomileusis; HTN hypertension; DM diabetes mellitus; COPD chronic obstructive pulmonary disease

## 2022-06-10 ENCOUNTER — Ambulatory Visit (INDEPENDENT_AMBULATORY_CARE_PROVIDER_SITE_OTHER): Payer: BC Managed Care – PPO | Admitting: Ophthalmology

## 2022-06-10 ENCOUNTER — Encounter (INDEPENDENT_AMBULATORY_CARE_PROVIDER_SITE_OTHER): Payer: Self-pay | Admitting: Ophthalmology

## 2022-06-10 DIAGNOSIS — Z961 Presence of intraocular lens: Secondary | ICD-10-CM

## 2022-06-10 DIAGNOSIS — H52532 Spasm of accommodation, left eye: Secondary | ICD-10-CM | POA: Insufficient documentation

## 2022-06-10 DIAGNOSIS — H33012 Retinal detachment with single break, left eye: Secondary | ICD-10-CM

## 2022-06-10 MED ORDER — ATROPINE SULFATE 1 % OP SOLN: 1.0000 [drp] | Freq: Three times a day (TID) | OPHTHALMIC | 0 refills | Status: DC

## 2022-06-10 NOTE — Assessment & Plan Note (Signed)
IOL stable

## 2022-06-10 NOTE — Assessment & Plan Note (Signed)
Nicely attached OS looks great

## 2022-06-10 NOTE — Assessment & Plan Note (Signed)
We will add atropine 1 drop left eye 1 or 2 or 3 times daily to break the aching pain cycle OS

## 2022-06-10 NOTE — Patient Instructions (Signed)
Add atropine 1% left eye 1 drop daily or twice daily or maximum 3 times daily for maximum of 2 weeks to break the pain cycle prescription sent to [facility]

## 2022-06-10 NOTE — Progress Notes (Signed)
06/10/2022     CHIEF COMPLAINT Patient presents for  Chief Complaint  Patient presents with   Post-op Follow-up      HISTORY OF PRESENT ILLNESS: Veronica Bailey is a 56 y.o. female who presents to the clinic today for:   HPI     Post-op Follow-up           Laterality: left eye         Comments   3 days status post repair complex retinal detachment, via scleral buckle, cryopexy, vitrectomy, endolaser, internal drainage subretinal fluid, temporary Perfluoron injection, injection C3F8 8% OS      Last edited by Edmon Crape, MD on 06/10/2022  4:11 PM.      Referring physician: Johny Blamer, MD [id number] [address] [place],  [place] [address]  HISTORICAL INFORMATION:   Selected notes from the MEDICAL RECORD NUMBER       CURRENT MEDICATIONS: Current Outpatient Medications (Ophthalmic Drugs)  Medication Sig   atropine 1 % ophthalmic solution Place 1 drop into the left eye 3 (three) times daily for 10 days.   ofloxacin (OCUFLOX) 0.3 % ophthalmic solution Place 1 drop into the left eye every 4 (four) hours for 10 days.   prednisoLONE acetate (PRED FORTE) 1 % ophthalmic suspension Place 1 drop into the left eye 4 (four) times daily.   No current facility-administered medications for this visit. (Ophthalmic Drugs)   Current Outpatient Medications (Other)  Medication Sig   buPROPion (WELLBUTRIN XL) 300 MG 24 hr tablet Take 300 mg by mouth daily.   ciprofloxacin (CIPRO) 500 MG tablet Take 1 tablet (500 mg total) by mouth 2 (two) times daily. One po bid x 7 days   clindamycin (CLEOCIN) 300 MG capsule Take 1 capsule (300 mg total) by mouth every 6 (six) hours.   FIBER PO Take 1 tablet by mouth at bedtime.   HYDROcodone-acetaminophen (NORCO/VICODIN) 5-325 MG tablet Take 1-2 tablets by mouth every 6 (six) hours as needed.   levonorgestrel (MIRENA) 20 MCG/24HR IUD 1 each by Intrauterine route once.   magnesium gluconate (MAGONATE) 500 MG tablet Take 500 mg by mouth  every evening.   metroNIDAZOLE (FLAGYL) 500 MG tablet Take 1 tablet (500 mg total) by mouth 3 (three) times daily. One po tid x 7 days   ondansetron (ZOFRAN ODT) 4 MG disintegrating tablet Take 1 tablet (4 mg total) by mouth every 8 (eight) hours as needed for nausea.   rizatriptan (MAXALT) 10 MG tablet Take 10 mg by mouth daily as needed for migraine. May repeat in 2 hours if needed   zolpidem (AMBIEN) 10 MG tablet Take 5 mg by mouth at bedtime.    No current facility-administered medications for this visit. (Other)      REVIEW OF SYSTEMS: ROS   Negative for: Constitutional, Gastrointestinal, Neurological, Skin, Genitourinary, Musculoskeletal, HENT, Endocrine, Cardiovascular, Eyes, Respiratory, Psychiatric, Allergic/Imm, Heme/Lymph Last edited by Edmon Crape, MD on 06/10/2022  4:06 PM.       ALLERGIES Allergies  Allergen Reactions   Benadryl [Diphenhydramine Hcl] Swelling    Swelling of throat   Imitrex [Sumatriptan] Other (See Comments)    palpations    PAST MEDICAL HISTORY Past Medical History:  Diagnosis Date   Diverticulitis    Migraine    Past Surgical History:  Procedure Laterality Date   APPENDECTOMY     BACK SURGERY     CHOLECYSTECTOMY     LAPAROSCOPIC GASTRIC BANDING     LAPAROSCOPIC REVISION  OF GASTRIC BAND      FAMILY HISTORY No family history on file.  SOCIAL HISTORY Social History   Tobacco Use   Smoking status: Never   Smokeless tobacco: Never  Substance Use Topics   Alcohol use: No   Drug use: No         OPHTHALMIC EXAM:  Base Eye Exam     Visual Acuity (ETDRS)       Right Left   Dist [place]  HM         Tonometry (Tonopen, 4:11 PM)       Right Left   Pressure  16         Pupils       Pupils   Right PERRL   Left PERRL         Extraocular Movement       Right Left    Full, Ortho Full, Ortho         Dilation     Left eye: 1.0% Mydriacyl, 2.5% Phenylephrine @ 4:11 PM           Slit Lamp and Fundus Exam      External Exam       Right Left   External Normal Normal         Slit Lamp Exam       Right Left   Lids/Lashes  1+ ecchymosis, Centered posterior chamber intraocular lens   Conjunctiva/Sclera  2+ Injection, cilia upon the conjunctiva embedded   Cornea  Clear   Anterior Chamber  Deep and quiet   Iris  Round and reactive   Lens  Centered posterior chamber intraocular lens   Anterior Vitreous  85% gas         Fundus Exam       Right Left   Posterior Vitreous  85 to 90% gas   Periphery  Retina attached posteriorly            IMAGING AND PROCEDURES  Imaging and Procedures for 06/10/22           ASSESSMENT/PLAN:  Ciliary body spasm, left We will add atropine 1 drop left eye 1 or 2 or 3 times daily to break the aching pain cycle OS  New partial retinal detachment of left eye with single defect Nicely attached OS looks great  Pseudophakia of left eye IOL stable      ICD-10-CM   1. Ciliary body spasm, left  H52.532     2. New partial retinal detachment of left eye with single defect  H33.012     3. Pseudophakia of left eye  Z96.1       1.  OS, topical tropicamide and phenylephrine applied in attempt to break the cycloplegic cycle of pain.  If in fact 15 minutes now her pain is diminished I recommend she seek atropine prescription that [facility]  2.  Embedded cilia, loose eyelash removed with cotton swab atraumatically.  3.  Ophthalmic Meds Ordered this visit:  Meds ordered this encounter  Medications   atropine 1 % ophthalmic solution    Sig: Place 1 drop into the left eye 3 (three) times daily for 10 days.    Dispense:  2 mL    Refill:  0       Return for As scheduled 3 days.  There are no Patient Instructions on file for this visit.   Explained the diagnoses, plan, and follow up with the patient and they expressed  understanding.  Patient expressed understanding of the importance of proper follow up care.    Alford Highland Nasya Vincent M.D. Diseases & Surgery of the Retina and Vitreous [facility] 06/10/22     Abbreviations: M myopia (nearsighted); A astigmatism; H hyperopia (farsighted); P presbyopia; Mrx spectacle prescription;  CTL contact lenses; OD right eye; OS left eye; OU both eyes  XT exotropia; ET esotropia; PEK punctate epithelial keratitis; PEE punctate epithelial erosions; DES dry eye syndrome; MGD meibomian gland dysfunction; ATs artificial tears; PFAT's preservative free artificial tears; [place] nuclear sclerotic cataract; PSC posterior subcapsular cataract; ERM epi-retinal membrane; PVD posterior vitreous detachment; RD retinal detachment; DM diabetes mellitus; DR diabetic retinopathy; NPDR non-proliferative diabetic retinopathy; PDR proliferative diabetic retinopathy; CSME clinically significant macular edema; DME diabetic macular edema; dbh dot blot hemorrhages; CWS cotton wool spot; POAG primary open angle glaucoma; C/D cup-to-disc ratio; HVF humphrey visual field; GVF goldmann visual field; OCT optical coherence tomography; IOP intraocular pressure; BRVO Branch retinal vein occlusion; CRVO central retinal vein occlusion; CRAO central retinal artery occlusion; BRAO branch retinal artery occlusion; RT retinal tear; SB scleral buckle; PPV pars plana vitrectomy; VH Vitreous hemorrhage; PRP panretinal laser photocoagulation; IVK intravitreal kenalog; VMT vitreomacular traction; MH Macular hole;  NVD neovascularization of the disc; NVE neovascularization elsewhere; AREDS age related eye disease study; ARMD age related macular degeneration; POAG primary open angle glaucoma; EBMD epithelial/anterior basement membrane dystrophy; ACIOL anterior chamber intraocular lens; IOL intraocular lens; PCIOL posterior chamber intraocular lens; Phaco/IOL phacoemulsification with intraocular lens placement; PRK photorefractive keratectomy; LASIK laser assisted in situ keratomileusis; HTN hypertension; DM  diabetes mellitus; COPD chronic obstructive pulmonary disease

## 2022-06-13 ENCOUNTER — Encounter (INDEPENDENT_AMBULATORY_CARE_PROVIDER_SITE_OTHER): Payer: Self-pay | Admitting: Ophthalmology

## 2022-06-13 ENCOUNTER — Ambulatory Visit (INDEPENDENT_AMBULATORY_CARE_PROVIDER_SITE_OTHER): Payer: BC Managed Care – PPO | Admitting: Ophthalmology

## 2022-06-13 DIAGNOSIS — H4052X2 Glaucoma secondary to other eye disorders, left eye, moderate stage: Secondary | ICD-10-CM

## 2022-06-13 DIAGNOSIS — H52532 Spasm of accommodation, left eye: Secondary | ICD-10-CM

## 2022-06-13 DIAGNOSIS — H33012 Retinal detachment with single break, left eye: Secondary | ICD-10-CM

## 2022-06-13 MED ORDER — DORZOLAMIDE HCL 2 % OP SOLN: 1.0000 [drp] | Freq: Three times a day (TID) | OPHTHALMIC | 1 refills | Status: AC

## 2022-06-13 MED ORDER — TIMOLOL MALEATE 0.25 % OP SOLN: 1.0000 [drp] | Freq: Two times a day (BID) | OPHTHALMIC | 1 refills | Status: DC

## 2022-06-13 NOTE — Assessment & Plan Note (Signed)
We will commence therapy with multimodal.  We will add Timoptic 0.5% 1 drop left eye twice daily as well as dorzolamide 1 drop left eye 3 times daily

## 2022-06-13 NOTE — Assessment & Plan Note (Addendum)
Ciliary body spasm and comfort level much improved post use of atropine using it mostly 3 times daily, however may have some component of increased IOP  I will asked the patient to use it once nightly for comfort through the night in order to sleep and if required upon awakening as well

## 2022-06-13 NOTE — Progress Notes (Signed)
06/13/2022     CHIEF COMPLAINT Patient presents for  Chief Complaint  Patient presents with   Post-op Follow-up      HISTORY OF PRESENT ILLNESS: Veronica Bailey is a 56 y.o. female who presents to the clinic today for:     Referring physician: Johny Blamer, MD [phone] [address] [place],  [place] [address]  HISTORICAL INFORMATION:   Selected notes from the MEDICAL RECORD NUMBER       CURRENT MEDICATIONS: Current Outpatient Medications (Ophthalmic Drugs)  Medication Sig   dorzolamide (TRUSOPT) 2 % ophthalmic solution Place 1 drop into the left eye 3 (three) times daily.   timolol (TIMOPTIC) 0.25 % ophthalmic solution Place 1 drop into the left eye 2 (two) times daily.   atropine 1 % ophthalmic solution Place 1 drop into the left eye 3 (three) times daily for 10 days.   ofloxacin (OCUFLOX) 0.3 % ophthalmic solution Place 1 drop into the left eye every 4 (four) hours for 10 days.   prednisoLONE acetate (PRED FORTE) 1 % ophthalmic suspension Place 1 drop into the left eye 4 (four) times daily.   No current facility-administered medications for this visit. (Ophthalmic Drugs)   Current Outpatient Medications (Other)  Medication Sig   buPROPion (WELLBUTRIN XL) 300 MG 24 hr tablet Take 300 mg by mouth daily.   ciprofloxacin (CIPRO) 500 MG tablet Take 1 tablet (500 mg total) by mouth 2 (two) times daily. One po bid x 7 days   clindamycin (CLEOCIN) 300 MG capsule Take 1 capsule (300 mg total) by mouth every 6 (six) hours.   FIBER PO Take 1 tablet by mouth at bedtime.   HYDROcodone-acetaminophen (NORCO/VICODIN) 5-325 MG tablet Take 1-2 tablets by mouth every 6 (six) hours as needed.   levonorgestrel (MIRENA) 20 MCG/24HR IUD 1 each by Intrauterine route once.   magnesium gluconate (MAGONATE) 500 MG tablet Take 500 mg by mouth every evening.   metroNIDAZOLE (FLAGYL) 500 MG tablet Take 1 tablet (500 mg total) by mouth 3 (three) times daily. One po tid x 7 days   ondansetron  (ZOFRAN ODT) 4 MG disintegrating tablet Take 1 tablet (4 mg total) by mouth every 8 (eight) hours as needed for nausea.   rizatriptan (MAXALT) 10 MG tablet Take 10 mg by mouth daily as needed for migraine. May repeat in 2 hours if needed   zolpidem (AMBIEN) 10 MG tablet Take 5 mg by mouth at bedtime.    No current facility-administered medications for this visit. (Other)      REVIEW OF SYSTEMS: ROS   Negative for: Constitutional, Gastrointestinal, Neurological, Skin, Genitourinary, Musculoskeletal, HENT, Endocrine, Cardiovascular, Eyes, Respiratory, Psychiatric, Allergic/Imm, Heme/Lymph Last edited by Edmon Crape, MD on 06/13/2022  2:07 PM.       ALLERGIES Allergies  Allergen Reactions   Benadryl [Diphenhydramine Hcl] Swelling    Swelling of throat   Imitrex [Sumatriptan] Other (See Comments)    palpations    PAST MEDICAL HISTORY Past Medical History:  Diagnosis Date   Diverticulitis    Migraine    Past Surgical History:  Procedure Laterality Date   APPENDECTOMY     BACK SURGERY     CHOLECYSTECTOMY     LAPAROSCOPIC GASTRIC BANDING     LAPAROSCOPIC REVISION OF GASTRIC BAND      FAMILY HISTORY No family history on file.  SOCIAL HISTORY Social History   Tobacco Use   Smoking status: Never   Smokeless tobacco: Never  Substance Use Topics  Alcohol use: No   Drug use: No         OPHTHALMIC EXAM:  Slit Lamp and Fundus Exam     External Exam       Right Left   External Normal Normal         Slit Lamp Exam       Right Left   Lids/Lashes Normal 1+ ecchymosis, Centered posterior chamber intraocular lens   Conjunctiva/Sclera White and quiet 2+ Injection,    Cornea Clear Clear   Anterior Chamber Deep and quiet Deep and quiet   Iris Round and reactive Round and reactive   Lens 1+ Nuclear sclerosis Centered posterior chamber intraocular lens   Anterior Vitreous Normal 85% gas         Fundus Exam       Right Left   Posterior Vitreous  85 to 90%  gas   Disc  Normal   C/D Ratio  0.3   Macula  macula   Vessels  Normal   Periphery  Retina attached posteriorly            IMAGING AND PROCEDURES  Imaging and Procedures for 06/13/22           ASSESSMENT/PLAN:  Secondary glaucoma due to combination mechanisms, left, moderate stage We will commence therapy with multimodal.  We will add Timoptic 0.5% 1 drop left eye twice daily as well as dorzolamide 1 drop left eye 3 times daily  Ciliary body spasm, left Ciliary body spasm and comfort level much improved post use of atropine using it mostly 3 times daily, however may have some component of increased IOP  I will asked the patient to use it once nightly for comfort through the night in order to sleep and if required upon awakening as well  New partial retinal detachment of left eye with single defect OS, retina attached looks great.  Good clear view.  No recurrence of fluid no recurrence of PVR at this time     ICD-10-CM   1. Secondary glaucoma due to combination mechanisms, left, moderate stage  H40.52X2     2. Ciliary body spasm, left  H52.532     3. New partial retinal detachment of left eye with single defect  H33.012       1.  OS retina looks great, macula and retina periphery a attached good buckle.  Clear gas.  2.  Early spasm pain is improved with topical cycloplegic, atropine she says using it mostly twice a day but at x3 times daily.  3.  Secondary glaucoma left eye.  We will not change her Pred forte at this time but will add to medication dorzolamide and timolol in order to lower the intraocular pressure.  Patient does not need to look face down anymore.  Cannot sleep or rest on her back  Ophthalmic Meds Ordered this visit:  Meds ordered this encounter  Medications   dorzolamide (TRUSOPT) 2 % ophthalmic solution    Sig: Place 1 drop into the left eye 3 (three) times daily.    Dispense:  10 mL    Refill:  1   timolol (TIMOPTIC) 0.25 % ophthalmic  solution    Sig: Place 1 drop into the left eye 2 (two) times daily.    Dispense:  5 mL    Refill:  1       Return in about 5 days (around 06/18/2022) for IOP check with MD, NO DILATE.  There are no Patient Instructions on file  for this visit.   Explained the diagnoses, plan, and follow up with the patient and they expressed understanding.  Patient expressed understanding of the importance of proper follow up care.   Alford Highland Tereso Unangst M.D. Diseases & Surgery of the Retina and Vitreous [facility] 06/13/22     Abbreviations: M myopia (nearsighted); A astigmatism; H hyperopia (farsighted); P presbyopia; Mrx spectacle prescription;  CTL contact lenses; OD right eye; OS left eye; OU both eyes  XT exotropia; ET esotropia; PEK punctate epithelial keratitis; PEE punctate epithelial erosions; DES dry eye syndrome; MGD meibomian gland dysfunction; ATs artificial tears; PFAT's preservative free artificial tears; NSC nuclear sclerotic cataract; PSC posterior subcapsular cataract; ERM epi-retinal membrane; PVD posterior vitreous detachment; RD retinal detachment; DM diabetes mellitus; DR diabetic retinopathy; NPDR non-proliferative diabetic retinopathy; PDR proliferative diabetic retinopathy; CSME clinically significant macular edema; DME diabetic macular edema; dbh dot blot hemorrhages; CWS cotton wool spot; POAG primary open angle glaucoma; C/D cup-to-disc ratio; HVF humphrey visual field; GVF goldmann visual field; OCT optical coherence tomography; IOP intraocular pressure; BRVO Branch retinal vein occlusion; CRVO central retinal vein occlusion; CRAO central retinal artery occlusion; BRAO branch retinal artery occlusion; RT retinal tear; SB scleral buckle; PPV pars plana vitrectomy; VH Vitreous hemorrhage; PRP panretinal laser photocoagulation; IVK intravitreal kenalog; VMT vitreomacular traction; MH Macular hole;  NVD neovascularization of the disc; NVE neovascularization elsewhere;  AREDS age related eye disease study; ARMD age related macular degeneration; POAG primary open angle glaucoma; EBMD epithelial/anterior basement membrane dystrophy; ACIOL anterior chamber intraocular lens; IOL intraocular lens; PCIOL posterior chamber intraocular lens; Phaco/IOL phacoemulsification with intraocular lens placement; PRK photorefractive keratectomy; LASIK laser assisted in situ keratomileusis; HTN hypertension; DM diabetes mellitus; COPD chronic obstructive pulmonary disease

## 2022-06-13 NOTE — Assessment & Plan Note (Signed)
OS, retina attached looks great.  Good clear view.  No recurrence of fluid no recurrence of PVR at this time

## 2022-06-18 ENCOUNTER — Ambulatory Visit (INDEPENDENT_AMBULATORY_CARE_PROVIDER_SITE_OTHER): Payer: BC Managed Care – PPO | Admitting: Ophthalmology

## 2022-06-18 ENCOUNTER — Encounter (INDEPENDENT_AMBULATORY_CARE_PROVIDER_SITE_OTHER): Payer: Self-pay | Admitting: Ophthalmology

## 2022-06-18 DIAGNOSIS — H4052X2 Glaucoma secondary to other eye disorders, left eye, moderate stage: Secondary | ICD-10-CM | POA: Diagnosis not present

## 2022-06-18 DIAGNOSIS — T380X5A Adverse effect of glucocorticoids and synthetic analogues, initial encounter: Secondary | ICD-10-CM | POA: Diagnosis not present

## 2022-06-18 DIAGNOSIS — H4062X0 Glaucoma secondary to drugs, left eye, stage unspecified: Secondary | ICD-10-CM | POA: Diagnosis not present

## 2022-06-18 DIAGNOSIS — Z961 Presence of intraocular lens: Secondary | ICD-10-CM

## 2022-06-18 DIAGNOSIS — H33012 Retinal detachment with single break, left eye: Secondary | ICD-10-CM

## 2022-06-18 MED ORDER — LATANOPROST 0.005 % OP SOLN: 1.0000 [drp] | Freq: Every day | OPHTHALMIC | 1 refills | Status: DC

## 2022-06-18 MED ORDER — PREDNISOLONE ACETATE 1 % OP SUSP: 1.0000 [drp] | OPHTHALMIC | 0 refills | Status: AC

## 2022-06-18 NOTE — Assessment & Plan Note (Signed)
Retina remains nicely attached

## 2022-06-18 NOTE — Assessment & Plan Note (Signed)
Intraocular pressure has moderated now to 30 nonetheless patient continues on topical steroid possible steroid responsiveness as patient onto IOP lowering medications, timolol and dorzolamide.  We will taper topical prednisolone to 1 drop every other day left eye as well as add latanoprost nightly left eye.

## 2022-06-18 NOTE — Patient Instructions (Signed)
Continue prednisolone acetate 1 drop left eye every other day.  Continue timolol 1 drop left eye twice daily  Continue dorzolamide (orange top) 1 drop left eye 3 times daily  As needed use of atropine 1% once daily or discontinue  Ocuflox 1 drop left eye 3-4 times daily until this bottle complete do not refill

## 2022-06-18 NOTE — Assessment & Plan Note (Signed)
Stable OS 

## 2022-06-18 NOTE — Assessment & Plan Note (Signed)
Apical topical Pred forte OS to once every other day.

## 2022-06-18 NOTE — Progress Notes (Signed)
06/18/2022     CHIEF COMPLAINT Patient presents for  Chief Complaint  Patient presents with   Retina Follow Up      HISTORY OF PRESENT ILLNESS: Veronica Bailey is a 56 y.o. female who presents to the clinic today for:   HPI     Retina Follow Up           Diagnosis: Other   Laterality: left eye   Severity: moderate   Course: stable         Comments   5 days for IOP Check with MD, NO DILATE. Pt stated vision has not changed but only expressed concern for pain. Pt described pain as a dull headache.  Pt stated she has a dull headache in the back of her left eye.        Last edited by Angeline Slim on 06/18/2022  3:02 PM.      Referring physician: Johny Blamer, MD [phone] [address] [place],  [place] [address]  HISTORICAL INFORMATION:   Selected notes from the MEDICAL RECORD NUMBER       CURRENT MEDICATIONS: Current Outpatient Medications (Ophthalmic Drugs)  Medication Sig   latanoprost (XALATAN) 0.005 % ophthalmic solution Place 1 drop into the left eye at bedtime.   atropine 1 % ophthalmic solution Place 1 drop into the left eye 3 (three) times daily for 10 days.   dorzolamide (TRUSOPT) 2 % ophthalmic solution Place 1 drop into the left eye 3 (three) times daily.   prednisoLONE acetate (PRED FORTE) 1 % ophthalmic suspension Place 1 drop into the left eye every other day for 7 days.   timolol (TIMOPTIC) 0.25 % ophthalmic solution Place 1 drop into the left eye 2 (two) times daily.   No current facility-administered medications for this visit. (Ophthalmic Drugs)   Current Outpatient Medications (Other)  Medication Sig   buPROPion (WELLBUTRIN XL) 300 MG 24 hr tablet Take 300 mg by mouth daily.   ciprofloxacin (CIPRO) 500 MG tablet Take 1 tablet (500 mg total) by mouth 2 (two) times daily. One po bid x 7 days   clindamycin (CLEOCIN) 300 MG capsule Take 1 capsule (300 mg total) by mouth every 6 (six) hours.   FIBER PO Take 1 tablet by mouth at bedtime.    HYDROcodone-acetaminophen (NORCO/VICODIN) 5-325 MG tablet Take 1-2 tablets by mouth every 6 (six) hours as needed.   levonorgestrel (MIRENA) 20 MCG/24HR IUD 1 each by Intrauterine route once.   magnesium gluconate (MAGONATE) 500 MG tablet Take 500 mg by mouth every evening.   metroNIDAZOLE (FLAGYL) 500 MG tablet Take 1 tablet (500 mg total) by mouth 3 (three) times daily. One po tid x 7 days   ondansetron (ZOFRAN ODT) 4 MG disintegrating tablet Take 1 tablet (4 mg total) by mouth every 8 (eight) hours as needed for nausea.   rizatriptan (MAXALT) 10 MG tablet Take 10 mg by mouth daily as needed for migraine. May repeat in 2 hours if needed   zolpidem (AMBIEN) 10 MG tablet Take 5 mg by mouth at bedtime.    No current facility-administered medications for this visit. (Other)      REVIEW OF SYSTEMS: ROS   Negative for: Constitutional, Gastrointestinal, Neurological, Skin, Genitourinary, Musculoskeletal, HENT, Endocrine, Cardiovascular, Eyes, Respiratory, Psychiatric, Allergic/Imm, Heme/Lymph Last edited by Angeline Slim on 06/18/2022  2:55 PM.       ALLERGIES Allergies  Allergen Reactions   Benadryl [Diphenhydramine Hcl] Swelling    Swelling of throat  Imitrex [Sumatriptan] Other (See Comments)    palpations    PAST MEDICAL HISTORY Past Medical History:  Diagnosis Date   Diverticulitis    Migraine    Past Surgical History:  Procedure Laterality Date   APPENDECTOMY     BACK SURGERY     CHOLECYSTECTOMY     LAPAROSCOPIC GASTRIC BANDING     LAPAROSCOPIC REVISION OF GASTRIC BAND      FAMILY HISTORY History reviewed. No pertinent family history.  SOCIAL HISTORY Social History   Tobacco Use   Smoking status: Never   Smokeless tobacco: Never  Substance Use Topics   Alcohol use: No   Drug use: No         OPHTHALMIC EXAM:  Base Eye Exam     Visual Acuity (ETDRS)       Right Left   Dist [place] 20/30 -1 HM   Dist ph [place] 20/20 -2          Tonometry (Tonopen, 2:59 PM)        Right Left   Pressure 17 30         Pupils       Pupils APD   Right PERRL None   Left PERRL None         Visual Fields       Left Right     Full         Extraocular Movement       Right Left    Full Full         Neuro/Psych     Oriented x3: Yes         Dilation     Both eyes: 1.0% Mydriacyl @ 2:59 PM           Slit Lamp and Fundus Exam     External Exam       Right Left   External Normal Normal         Slit Lamp Exam       Right Left   Lids/Lashes Normal 1+ ecchymosis, Centered posterior chamber intraocular lens   Conjunctiva/Sclera White and quiet 2+ Injection,    Cornea Clear Clear   Anterior Chamber Deep and quiet Deep and quiet   Iris Round and reactive Round and reactive   Lens 1+ Nuclear sclerosis Centered posterior chamber intraocular lens   Anterior Vitreous Normal 85% gas         Fundus Exam       Right Left   Posterior Vitreous  75% gas   Disc  Normal   C/D Ratio  0.3   Macula  macula   Vessels  Normal   Periphery  Retina attached posteriorly, good scleral buckle good chorioretinal retinal scarring cryo and laser photocoagulation, no PVR, retina attached completely            IMAGING AND PROCEDURES  Imaging and Procedures for 06/18/22           ASSESSMENT/PLAN:  New partial retinal detachment of left eye with single defect Retina remains nicely attached  Pseudophakia of left eye Stable OS  Secondary glaucoma due to combination mechanisms, left, moderate stage Intraocular pressure has moderated now to 30 nonetheless patient continues on topical steroid possible steroid responsiveness as patient onto IOP lowering medications, timolol and dorzolamide.  We will taper topical prednisolone to 1 drop every other day left eye as well as add latanoprost nightly left eye.  Steroid induced glaucoma, left eye Apical topical Pred forte OS to once  every other day.      ICD-10-CM   1. New partial  retinal detachment of left eye with single defect  H33.012     2. Pseudophakia of left eye  Z96.1     3. Secondary glaucoma due to combination mechanisms, left, moderate stage  H40.52X2     4. Steroid induced glaucoma, left eye  H40.62X0    T38.0X5A       1.  We will continue to monitor as IOP is diminished slightly but is not at the appropriate level for now.  We will continue topical medications as described and add latanoprost left eye nightly.  2.  3.  Ophthalmic Meds Ordered this visit:  Meds ordered this encounter  Medications   prednisoLONE acetate (PRED FORTE) 1 % ophthalmic suspension    Sig: Place 1 drop into the left eye every other day for 7 days.    Dispense:  10 mL    Refill:  0   latanoprost (XALATAN) 0.005 % ophthalmic solution    Sig: Place 1 drop into the left eye at bedtime.    Dispense:  2.5 mL    Refill:  1       Return in about 1 week (around 06/25/2022) for OS, NO DILATE, IOP check with MD..  Patient Instructions  Continue prednisolone acetate 1 drop left eye every other day.  Continue timolol 1 drop left eye twice daily  Continue dorzolamide (orange top) 1 drop left eye 3 times daily  As needed use of atropine 1% once daily or discontinue  Ocuflox 1 drop left eye 3-4 times daily until this bottle complete do not refill  Explained the diagnoses, plan, and follow up with the patient and they expressed understanding.  Patient expressed understanding of the importance of proper follow up care.   Alford Highland Murrel Freet M.D. Diseases & Surgery of the Retina and Vitreous [facility] 06/18/22     Abbreviations: M myopia (nearsighted); A astigmatism; H hyperopia (farsighted); P presbyopia; Mrx spectacle prescription;  CTL contact lenses; OD right eye; OS left eye; OU both eyes  XT exotropia; ET esotropia; PEK punctate epithelial keratitis; PEE punctate epithelial erosions; DES dry eye syndrome; MGD meibomian gland dysfunction; ATs  artificial tears; PFAT's preservative free artificial tears; NSC nuclear sclerotic cataract; PSC posterior subcapsular cataract; ERM epi-retinal membrane; PVD posterior vitreous detachment; RD retinal detachment; DM diabetes mellitus; DR diabetic retinopathy; NPDR non-proliferative diabetic retinopathy; PDR proliferative diabetic retinopathy; CSME clinically significant macular edema; DME diabetic macular edema; dbh dot blot hemorrhages; CWS cotton wool spot; POAG primary open angle glaucoma; C/D cup-to-disc ratio; HVF humphrey visual field; GVF goldmann visual field; OCT optical coherence tomography; IOP intraocular pressure; BRVO Branch retinal vein occlusion; CRVO central retinal vein occlusion; CRAO central retinal artery occlusion; BRAO branch retinal artery occlusion; RT retinal tear; SB scleral buckle; PPV pars plana vitrectomy; VH Vitreous hemorrhage; PRP panretinal laser photocoagulation; IVK intravitreal kenalog; VMT vitreomacular traction; MH Macular hole;  NVD neovascularization of the disc; NVE neovascularization elsewhere; AREDS age related eye disease study; ARMD age related macular degeneration; POAG primary open angle glaucoma; EBMD epithelial/anterior basement membrane dystrophy; ACIOL anterior chamber intraocular lens; IOL intraocular lens; PCIOL posterior chamber intraocular lens; Phaco/IOL phacoemulsification with intraocular lens placement; PRK photorefractive keratectomy; LASIK laser assisted in situ keratomileusis; HTN hypertension; DM diabetes mellitus; COPD chronic obstructive pulmonary disease

## 2022-06-25 ENCOUNTER — Encounter (INDEPENDENT_AMBULATORY_CARE_PROVIDER_SITE_OTHER): Payer: Self-pay | Admitting: Ophthalmology

## 2022-06-25 ENCOUNTER — Encounter (INDEPENDENT_AMBULATORY_CARE_PROVIDER_SITE_OTHER): Payer: BC Managed Care – PPO | Admitting: Ophthalmology

## 2022-06-25 ENCOUNTER — Ambulatory Visit (INDEPENDENT_AMBULATORY_CARE_PROVIDER_SITE_OTHER): Payer: BC Managed Care – PPO | Admitting: Ophthalmology

## 2022-06-25 DIAGNOSIS — H4052X2 Glaucoma secondary to other eye disorders, left eye, moderate stage: Secondary | ICD-10-CM | POA: Diagnosis not present

## 2022-06-25 DIAGNOSIS — H4062X0 Glaucoma secondary to drugs, left eye, stage unspecified: Secondary | ICD-10-CM

## 2022-06-25 DIAGNOSIS — Z124 Encounter for screening for malignant neoplasm of cervix: Secondary | ICD-10-CM | POA: Diagnosis not present

## 2022-06-25 DIAGNOSIS — H52532 Spasm of accommodation, left eye: Secondary | ICD-10-CM | POA: Diagnosis not present

## 2022-06-25 DIAGNOSIS — T380X5A Adverse effect of glucocorticoids and synthetic analogues, initial encounter: Secondary | ICD-10-CM

## 2022-06-25 DIAGNOSIS — Z6829 Body mass index (BMI) 29.0-29.9, adult: Secondary | ICD-10-CM | POA: Diagnosis not present

## 2022-06-25 DIAGNOSIS — Z01419 Encounter for gynecological examination (general) (routine) without abnormal findings: Secondary | ICD-10-CM | POA: Diagnosis not present

## 2022-06-25 NOTE — Patient Instructions (Addendum)
Continue on Pred forte 1 drop left eye every other day.  Patient may continue on atropine once or twice daily as needed discomfort aching.  Continue latanoprost (teal top) nightly left eye  Continue timolol (yellow top) twice daily left eye  Discontinue orange top, dorzolamide, but do not discard

## 2022-06-25 NOTE — Assessment & Plan Note (Signed)
Intraocular pressure is now normalized on lower dosing of steroid.

## 2022-06-25 NOTE — Progress Notes (Signed)
06/25/2022     CHIEF COMPLAINT Patient presents for  Chief Complaint  Patient presents with   Post-op Follow-up      HISTORY OF PRESENT ILLNESS: Veronica Bailey is a 56 y.o. female who presents to the clinic today for:   HPI     Post-op Follow-up           Laterality: left eye         Comments   1 week no dilate IOP check for history of steroid-induced glaucoma.  Now comfortable left eye. Pt states vision has been stable  Pt admits floaters in her right eye  Pt states her vision has not come back just yet, seeing gas bubble in her line of vision.      Last edited by Edmon Crape, MD on 06/25/2022  9:29 AM.      Referring physician: Johny Blamer, MD [phone] [address] [place],  [place] [address]  HISTORICAL INFORMATION:   Selected notes from the MEDICAL RECORD NUMBER       CURRENT MEDICATIONS: Current Outpatient Medications (Ophthalmic Drugs)  Medication Sig   atropine 1 % ophthalmic solution Place 1 drop into the left eye 3 (three) times daily for 10 days.   dorzolamide (TRUSOPT) 2 % ophthalmic solution Place 1 drop into the left eye 3 (three) times daily.   latanoprost (XALATAN) 0.005 % ophthalmic solution Place 1 drop into the left eye at bedtime.   prednisoLONE acetate (PRED FORTE) 1 % ophthalmic suspension Place 1 drop into the left eye every other day for 7 days.   timolol (TIMOPTIC) 0.25 % ophthalmic solution Place 1 drop into the left eye 2 (two) times daily.   No current facility-administered medications for this visit. (Ophthalmic Drugs)   Current Outpatient Medications (Other)  Medication Sig   buPROPion (WELLBUTRIN XL) 300 MG 24 hr tablet Take 300 mg by mouth daily.   ciprofloxacin (CIPRO) 500 MG tablet Take 1 tablet (500 mg total) by mouth 2 (two) times daily. One po bid x 7 days   clindamycin (CLEOCIN) 300 MG capsule Take 1 capsule (300 mg total) by mouth every 6 (six) hours.   FIBER PO Take 1 tablet by mouth at bedtime.    HYDROcodone-acetaminophen (NORCO/VICODIN) 5-325 MG tablet Take 1-2 tablets by mouth every 6 (six) hours as needed.   levonorgestrel (MIRENA) 20 MCG/24HR IUD 1 each by Intrauterine route once.   magnesium gluconate (MAGONATE) 500 MG tablet Take 500 mg by mouth every evening.   metroNIDAZOLE (FLAGYL) 500 MG tablet Take 1 tablet (500 mg total) by mouth 3 (three) times daily. One po tid x 7 days   ondansetron (ZOFRAN ODT) 4 MG disintegrating tablet Take 1 tablet (4 mg total) by mouth every 8 (eight) hours as needed for nausea.   rizatriptan (MAXALT) 10 MG tablet Take 10 mg by mouth daily as needed for migraine. May repeat in 2 hours if needed   zolpidem (AMBIEN) 10 MG tablet Take 5 mg by mouth at bedtime.    No current facility-administered medications for this visit. (Other)      REVIEW OF SYSTEMS: ROS   Negative for: Constitutional, Gastrointestinal, Neurological, Skin, Genitourinary, Musculoskeletal, HENT, Endocrine, Cardiovascular, Eyes, Respiratory, Psychiatric, Allergic/Imm, Heme/Lymph Last edited by Aleene Davidson, CMA on 06/25/2022  9:16 AM.       ALLERGIES Allergies  Allergen Reactions   Benadryl [Diphenhydramine Hcl] Swelling    Swelling of throat   Imitrex [Sumatriptan] Other (See Comments)  palpations    PAST MEDICAL HISTORY Past Medical History:  Diagnosis Date   Diverticulitis    Migraine    Past Surgical History:  Procedure Laterality Date   APPENDECTOMY     BACK SURGERY     CHOLECYSTECTOMY     LAPAROSCOPIC GASTRIC BANDING     LAPAROSCOPIC REVISION OF GASTRIC BAND      FAMILY HISTORY History reviewed. No pertinent family history.  SOCIAL HISTORY Social History   Tobacco Use   Smoking status: Never   Smokeless tobacco: Never  Substance Use Topics   Alcohol use: No   Drug use: No         OPHTHALMIC EXAM:  Base Eye Exam     Visual Acuity (Snellen - Linear)       Right Left   Dist [place] 20/25 HM   Dist cc 20/20          Tonometry  (Tonopen, 9:20 AM)       Right Left   Pressure 15 15         Neuro/Psych     Oriented x3: Yes         Dilation     Left eye: 2.5% Phenylephrine, 1.0% Mydriacyl @ 9:17 AM           Slit Lamp and Fundus Exam     External Exam       Right Left   External Normal Normal         Slit Lamp Exam       Right Left   Lids/Lashes Normal 1+ ecchymosis, Centered posterior chamber intraocular lens   Conjunctiva/Sclera White and quiet 2+ Injection,    Cornea Clear Clear   Anterior Chamber Deep and quiet Deep and quiet   Iris Round and reactive pharmacological dilated   Lens 1+ Nuclear sclerosis Centered posterior chamber intraocular lens   Anterior Vitreous Normal 75% gas         Fundus Exam       Right Left   Posterior Vitreous  75% gas   Disc  Normal   C/D Ratio  0.3   Macula  macula   Vessels  Normal   Periphery  Retina attached posteriorly, good scleral buckle good chorioretinal retinal scarring cryo and laser photocoagulation, no PVR, retina attached completely            IMAGING AND PROCEDURES  Imaging and Procedures for 06/25/22           ASSESSMENT/PLAN:  Ciliary body spasm, left Condition remains controlled on topical atropine medication  Secondary glaucoma due to combination mechanisms, left, moderate stage Intraocular pressure is now normalized on lower dosing of steroid.  Steroid induced glaucoma, left eye Intraocular pressure now controlled     ICD-10-CM   1. Ciliary body spasm, left  H52.532     2. Secondary glaucoma due to combination mechanisms, left, moderate stage  H40.52X2     3. Steroid induced glaucoma, left eye  H40.62X0    T38.0X5A       1.  OS doing very well.  Retina nicely attached.  70 to 75% gas bubble in place.  Patient's restrictions of positioning and reviewed again.  2.  Intraocular pressure of modest elevation moderate elevation into the low 30s has improved nicely with near complete cessation of steroid.   Patient to continue on Pred forte 1 drop left eye every other day  3.  Ophthalmic Meds Ordered this visit:  No orders of the defined types  were placed in this encounter.      Return in about 3 weeks (around 07/16/2022) for dilate, OS, COLOR FP, OCT, POST OP.  Patient Instructions  Continue on Pred forte 1 drop left eye every other day.  Patient may continue on atropine once or twice daily as needed discomfort aching.  Continue latanoprost (teal top) nightly left eye  Continue timolol (yellow top) twice daily left eye  Discontinue orange top, dorzolamide, but do not discard   Explained the diagnoses, plan, and follow up with the patient and they expressed understanding.  Patient expressed understanding of the importance of proper follow up care.   Alford Highland Renalda Locklin M.D. Diseases & Surgery of the Retina and Vitreous [facility] 06/25/22     Abbreviations: M myopia (nearsighted); A astigmatism; H hyperopia (farsighted); P presbyopia; Mrx spectacle prescription;  CTL contact lenses; OD right eye; OS left eye; OU both eyes  XT exotropia; ET esotropia; PEK punctate epithelial keratitis; PEE punctate epithelial erosions; DES dry eye syndrome; MGD meibomian gland dysfunction; ATs artificial tears; PFAT's preservative free artificial tears; NSC nuclear sclerotic cataract; PSC posterior subcapsular cataract; ERM epi-retinal membrane; PVD posterior vitreous detachment; RD retinal detachment; DM diabetes mellitus; DR diabetic retinopathy; NPDR non-proliferative diabetic retinopathy; PDR proliferative diabetic retinopathy; CSME clinically significant macular edema; DME diabetic macular edema; dbh dot blot hemorrhages; CWS cotton wool spot; POAG primary open angle glaucoma; C/D cup-to-disc ratio; HVF humphrey visual field; GVF goldmann visual field; OCT optical coherence tomography; IOP intraocular pressure; BRVO Branch retinal vein occlusion; CRVO central retinal vein occlusion;  CRAO central retinal artery occlusion; BRAO branch retinal artery occlusion; RT retinal tear; SB scleral buckle; PPV pars plana vitrectomy; VH Vitreous hemorrhage; PRP panretinal laser photocoagulation; IVK intravitreal kenalog; VMT vitreomacular traction; MH Macular hole;  NVD neovascularization of the disc; NVE neovascularization elsewhere; AREDS age related eye disease study; ARMD age related macular degeneration; POAG primary open angle glaucoma; EBMD epithelial/anterior basement membrane dystrophy; ACIOL anterior chamber intraocular lens; IOL intraocular lens; PCIOL posterior chamber intraocular lens; Phaco/IOL phacoemulsification with intraocular lens placement; PRK photorefractive keratectomy; LASIK laser assisted in situ keratomileusis; HTN hypertension; DM diabetes mellitus; COPD chronic obstructive pulmonary disease

## 2022-06-25 NOTE — Assessment & Plan Note (Signed)
Condition remains controlled on topical atropine medication

## 2022-06-25 NOTE — Assessment & Plan Note (Signed)
Intraocular pressure now controlled

## 2022-06-27 ENCOUNTER — Ambulatory Visit (INDEPENDENT_AMBULATORY_CARE_PROVIDER_SITE_OTHER): Payer: BC Managed Care – PPO | Admitting: Ophthalmology

## 2022-06-27 ENCOUNTER — Encounter (INDEPENDENT_AMBULATORY_CARE_PROVIDER_SITE_OTHER): Payer: Self-pay | Admitting: Ophthalmology

## 2022-06-27 DIAGNOSIS — H4052X2 Glaucoma secondary to other eye disorders, left eye, moderate stage: Secondary | ICD-10-CM

## 2022-06-27 DIAGNOSIS — H33012 Retinal detachment with single break, left eye: Secondary | ICD-10-CM

## 2022-06-27 NOTE — Progress Notes (Signed)
06/27/2022     CHIEF COMPLAINT Patient presents for  Chief Complaint  Patient presents with   Eye Pain      HISTORY OF PRESENT ILLNESS: Veronica Bailey is a 56 y.o. female who presents to the clinic today for:   HPI     Eye Pain           Laterality: In left eye   Quality: aching and burning   Frequency: constantly         Comments   WIP- Pain OS sever h/a op sx 06/18/22 Pt states vision has been stable  Pt admits to sparkles in her eye when she closes her eye earlier today before she came in       Last edited by Aleene Davidson, CMA on 06/27/2022  1:29 PM.      Referring physician: Johny Blamer, MD [phone] [address] [place],  [place] [address]  HISTORICAL INFORMATION:   Selected notes from the MEDICAL RECORD NUMBER       CURRENT MEDICATIONS: Current Outpatient Medications (Ophthalmic Drugs)  Medication Sig   atropine 1 % ophthalmic solution Place 1 drop into the left eye 3 (three) times daily for 10 days.   dorzolamide (TRUSOPT) 2 % ophthalmic solution Place 1 drop into the left eye 3 (three) times daily.   latanoprost (XALATAN) 0.005 % ophthalmic solution Place 1 drop into the left eye at bedtime.   timolol (TIMOPTIC) 0.25 % ophthalmic solution Place 1 drop into the left eye 2 (two) times daily.   No current facility-administered medications for this visit. (Ophthalmic Drugs)   Current Outpatient Medications (Other)  Medication Sig   buPROPion (WELLBUTRIN XL) 300 MG 24 hr tablet Take 300 mg by mouth daily.   ciprofloxacin (CIPRO) 500 MG tablet Take 1 tablet (500 mg total) by mouth 2 (two) times daily. One po bid x 7 days   clindamycin (CLEOCIN) 300 MG capsule Take 1 capsule (300 mg total) by mouth every 6 (six) hours.   FIBER PO Take 1 tablet by mouth at bedtime.   HYDROcodone-acetaminophen (NORCO/VICODIN) 5-325 MG tablet Take 1-2 tablets by mouth every 6 (six) hours as needed.   levonorgestrel (MIRENA) 20 MCG/24HR IUD 1 each by  Intrauterine route once.   magnesium gluconate (MAGONATE) 500 MG tablet Take 500 mg by mouth every evening.   metroNIDAZOLE (FLAGYL) 500 MG tablet Take 1 tablet (500 mg total) by mouth 3 (three) times daily. One po tid x 7 days   ondansetron (ZOFRAN ODT) 4 MG disintegrating tablet Take 1 tablet (4 mg total) by mouth every 8 (eight) hours as needed for nausea.   rizatriptan (MAXALT) 10 MG tablet Take 10 mg by mouth daily as needed for migraine. May repeat in 2 hours if needed   zolpidem (AMBIEN) 10 MG tablet Take 5 mg by mouth at bedtime.    No current facility-administered medications for this visit. (Other)      REVIEW OF SYSTEMS: ROS   Negative for: Constitutional, Gastrointestinal, Neurological, Skin, Genitourinary, Musculoskeletal, HENT, Endocrine, Cardiovascular, Eyes, Respiratory, Psychiatric, Allergic/Imm, Heme/Lymph Last edited by Edmon Crape, MD on 06/27/2022  1:57 PM.       ALLERGIES Allergies  Allergen Reactions   Benadryl [Diphenhydramine Hcl] Swelling    Swelling of throat   Imitrex [Sumatriptan] Other (See Comments)    palpations    PAST MEDICAL HISTORY Past Medical History:  Diagnosis Date   Diverticulitis    Migraine    Past Surgical  History:  Procedure Laterality Date   APPENDECTOMY     BACK SURGERY     CHOLECYSTECTOMY     LAPAROSCOPIC GASTRIC BANDING     LAPAROSCOPIC REVISION OF GASTRIC BAND      FAMILY HISTORY History reviewed. No pertinent family history.  SOCIAL HISTORY Social History   Tobacco Use   Smoking status: Never   Smokeless tobacco: Never  Substance Use Topics   Alcohol use: No   Drug use: No         OPHTHALMIC EXAM:  Base Eye Exam     Visual Acuity (ETDRS)       Right Left   Dist [place] 20/25 HM         Tonometry (Tonopen, 1:31 PM)       Right Left   Pressure 13 11         Neuro/Psych     Oriented x3: Yes         Dilation     Both eyes: 1.0% Mydriacyl @ 1:32 PM           Slit Lamp and Fundus  Exam     External Exam       Right Left   External Normal Normal         Slit Lamp Exam       Right Left   Lids/Lashes Normal 1+ ecchymosis, Centered posterior chamber intraocular lens   Conjunctiva/Sclera White and quiet 2+ Injection, conj well secured, loose vicryl at 5 meridian, touching perpheral cornea,, no epi defect   Cornea Clear Clear   Anterior Chamber Deep and quiet Deep and quiet   Iris Round and reactive pharmacological dilated   Lens 1+ Nuclear sclerosis Centered posterior chamber intraocular lens   Anterior Vitreous Normal 75% gas         Fundus Exam       Right Left   Posterior Vitreous  75% gas   Disc  Normal   C/D Ratio  0.3   Macula  macula   Vessels  Normal   Periphery  Retina attached posteriorly, good scleral buckle good chorioretinal retinal scarring cryo and laser photocoagulation, no PVR, retina attached completely            IMAGING AND PROCEDURES  Imaging and Procedures for 06/27/22           ASSESSMENT/PLAN:  Secondary glaucoma due to combination mechanisms, left, moderate stage Intraocular pressure OS now normalized.  Patient informed however if she does develop a pulsating achy heaviness type feeling in her around the forehead of that left eye that she should immediately look down with a little bit.  New partial retinal detachment of left eye with single defect OS still looks great, retina completely attached.  Good buckle effect good cryo effect macula flat.  Loose suture today with foreign body sensation.  Alleviated immediately after topical proparacaine applied followed by topical tobramycin.  The loose suture, Vicryl, was transected with Vannas scissors and removed atraumatically.  Topic antibiotic drop applied again.  Under TIVA closure remained stable and and excellent     ICD-10-CM   1. Secondary glaucoma due to combination mechanisms, left, moderate stage  H40.52X2     2. New partial retinal detachment of left eye  with single defect  H33.012       1.  OS looking great from the standpoint the retina reattachment.  2.  Suture removed.  3.  Tinea on topical medications  Ophthalmic Meds Ordered this visit:  No orders of the defined types were placed in this encounter.      Return for As scheduled, dilate, OS, COLOR FP.  There are no Patient Instructions on file for this visit.   Explained the diagnoses, plan, and follow up with the patient and they expressed understanding.  Patient expressed understanding of the importance of proper follow up care.   Alford Highland Abbee Cremeens M.D. Diseases & Surgery of the Retina and Vitreous [facility] 06/27/22     Abbreviations: M myopia (nearsighted); A astigmatism; H hyperopia (farsighted); P presbyopia; Mrx spectacle prescription;  CTL contact lenses; OD right eye; OS left eye; OU both eyes  XT exotropia; ET esotropia; PEK punctate epithelial keratitis; PEE punctate epithelial erosions; DES dry eye syndrome; MGD meibomian gland dysfunction; ATs artificial tears; PFAT's preservative free artificial tears; NSC nuclear sclerotic cataract; PSC posterior subcapsular cataract; ERM epi-retinal membrane; PVD posterior vitreous detachment; RD retinal detachment; DM diabetes mellitus; DR diabetic retinopathy; NPDR non-proliferative diabetic retinopathy; PDR proliferative diabetic retinopathy; CSME clinically significant macular edema; DME diabetic macular edema; dbh dot blot hemorrhages; CWS cotton wool spot; POAG primary open angle glaucoma; C/D cup-to-disc ratio; HVF humphrey visual field; GVF goldmann visual field; OCT optical coherence tomography; IOP intraocular pressure; BRVO Branch retinal vein occlusion; CRVO central retinal vein occlusion; CRAO central retinal artery occlusion; BRAO branch retinal artery occlusion; RT retinal tear; SB scleral buckle; PPV pars plana vitrectomy; VH Vitreous hemorrhage; PRP panretinal laser photocoagulation; IVK  intravitreal kenalog; VMT vitreomacular traction; MH Macular hole;  NVD neovascularization of the disc; NVE neovascularization elsewhere; AREDS age related eye disease study; ARMD age related macular degeneration; POAG primary open angle glaucoma; EBMD epithelial/anterior basement membrane dystrophy; ACIOL anterior chamber intraocular lens; IOL intraocular lens; PCIOL posterior chamber intraocular lens; Phaco/IOL phacoemulsification with intraocular lens placement; PRK photorefractive keratectomy; LASIK laser assisted in situ keratomileusis; HTN hypertension; DM diabetes mellitus; COPD chronic obstructive pulmonary disease

## 2022-06-27 NOTE — Assessment & Plan Note (Signed)
Intraocular pressure OS now normalized.  Patient informed however if she does develop a pulsating achy heaviness type feeling in her around the forehead of that left eye that she should immediately look down with a little bit.

## 2022-06-27 NOTE — Assessment & Plan Note (Signed)
OS still looks great, retina completely attached.  Good buckle effect good cryo effect macula flat.  Loose suture today with foreign body sensation.  Alleviated immediately after topical proparacaine applied followed by topical tobramycin.  The loose suture, Vicryl, was transected with Vannas scissors and removed atraumatically.  Topic antibiotic drop applied again.  Under TIVA closure remained stable and and excellent

## 2022-07-11 ENCOUNTER — Other Ambulatory Visit (INDEPENDENT_AMBULATORY_CARE_PROVIDER_SITE_OTHER): Payer: Self-pay | Admitting: Ophthalmology

## 2022-07-16 ENCOUNTER — Ambulatory Visit (INDEPENDENT_AMBULATORY_CARE_PROVIDER_SITE_OTHER): Payer: BC Managed Care – PPO | Admitting: Ophthalmology

## 2022-07-16 ENCOUNTER — Encounter (INDEPENDENT_AMBULATORY_CARE_PROVIDER_SITE_OTHER): Payer: Self-pay | Admitting: Ophthalmology

## 2022-07-16 DIAGNOSIS — H35352 Cystoid macular degeneration, left eye: Secondary | ICD-10-CM | POA: Diagnosis not present

## 2022-07-16 DIAGNOSIS — H4052X2 Glaucoma secondary to other eye disorders, left eye, moderate stage: Secondary | ICD-10-CM

## 2022-07-16 DIAGNOSIS — H33012 Retinal detachment with single break, left eye: Secondary | ICD-10-CM | POA: Diagnosis not present

## 2022-07-16 MED ORDER — TRIAMCINOLONE ACETONIDE 40 MG/ML IJ SUSP FOR KALEIDOSCOPE
40.0000 mg | INTRAMUSCULAR | Status: AC | PRN
  Administered 2022-07-16: 40 mg

## 2022-07-16 NOTE — Assessment & Plan Note (Signed)
OS looks great, retina attached.

## 2022-07-16 NOTE — Patient Instructions (Signed)
Continue on timolol 1 drop left eye twice daily  Continue on latanoprost 1 drop left eye once nightly.  May continues atropine as needed  Pred forte 1 drop left eye once every other day may continue for 2 more weeks

## 2022-07-16 NOTE — Assessment & Plan Note (Signed)
Residual CME from macula off detachment, reviewed with patient.  See now on OCT as gas bubble resolves.  Will deliver retroseptal Kenalog 40 mg to allow for improvement of CME over the coming weeks.

## 2022-07-16 NOTE — Progress Notes (Signed)
07/16/2022     CHIEF COMPLAINT Patient presents for  Chief Complaint  Patient presents with   Post-op Follow-up      HISTORY OF PRESENT ILLNESS: Veronica Bailey is a 56 y.o. female who presents to the clinic today for:   HPI     Post-op Follow-up           Laterality: left eye   Discomfort: pain, itching, tearing and floaters.  Negative for foreign body sensation and discharge   Vision: is improved         Comments   3 weeks for DILATE OS, COLOR FP, OCT, POST OP. Pt stated vision has improved however pt stated she feels pain in the left eye. Pt described pain as an ache.  Pt reports she can see a bit more of her peripheral.       Last edited by Angeline Slim on 07/16/2022  9:24 AM.      Referring physician: Johny Blamer, MD [phone] [address] [place],  [place] [address]  HISTORICAL INFORMATION:   Selected notes from the MEDICAL RECORD NUMBER       CURRENT MEDICATIONS: Current Outpatient Medications (Ophthalmic Drugs)  Medication Sig   atropine 1 % ophthalmic solution Place 1 drop into the left eye 3 (three) times daily for 10 days.   dorzolamide (TRUSOPT) 2 % ophthalmic solution Place 1 drop into the left eye 3 (three) times daily.   latanoprost (XALATAN) 0.005 % ophthalmic solution PLACE 1 DROP INTO THE LEFT EYE AT BEDTIME.   timolol (TIMOPTIC) 0.25 % ophthalmic solution Place 1 drop into the left eye 2 (two) times daily.   No current facility-administered medications for this visit. (Ophthalmic Drugs)   Current Outpatient Medications (Other)  Medication Sig   buPROPion (WELLBUTRIN XL) 300 MG 24 hr tablet Take 300 mg by mouth daily.   ciprofloxacin (CIPRO) 500 MG tablet Take 1 tablet (500 mg total) by mouth 2 (two) times daily. One po bid x 7 days   clindamycin (CLEOCIN) 300 MG capsule Take 1 capsule (300 mg total) by mouth every 6 (six) hours.   FIBER PO Take 1 tablet by mouth at bedtime.   HYDROcodone-acetaminophen (NORCO/VICODIN) 5-325 MG tablet  Take 1-2 tablets by mouth every 6 (six) hours as needed.   levonorgestrel (MIRENA) 20 MCG/24HR IUD 1 each by Intrauterine route once.   magnesium gluconate (MAGONATE) 500 MG tablet Take 500 mg by mouth every evening.   metroNIDAZOLE (FLAGYL) 500 MG tablet Take 1 tablet (500 mg total) by mouth 3 (three) times daily. One po tid x 7 days   ondansetron (ZOFRAN ODT) 4 MG disintegrating tablet Take 1 tablet (4 mg total) by mouth every 8 (eight) hours as needed for nausea.   rizatriptan (MAXALT) 10 MG tablet Take 10 mg by mouth daily as needed for migraine. May repeat in 2 hours if needed   zolpidem (AMBIEN) 10 MG tablet Take 5 mg by mouth at bedtime.    No current facility-administered medications for this visit. (Other)      REVIEW OF SYSTEMS: ROS   Negative for: Constitutional, Gastrointestinal, Neurological, Skin, Genitourinary, Musculoskeletal, HENT, Endocrine, Cardiovascular, Eyes, Respiratory, Psychiatric, Allergic/Imm, Heme/Lymph Last edited by Angeline Slim on 07/16/2022  9:22 AM.       ALLERGIES Allergies  Allergen Reactions   Benadryl [Diphenhydramine Hcl] Swelling    Swelling of throat   Imitrex [Sumatriptan] Other (See Comments)    palpations    PAST MEDICAL HISTORY Past Medical  History:  Diagnosis Date   Diverticulitis    Migraine    Past Surgical History:  Procedure Laterality Date   APPENDECTOMY     BACK SURGERY     CHOLECYSTECTOMY     LAPAROSCOPIC GASTRIC BANDING     LAPAROSCOPIC REVISION OF GASTRIC BAND      FAMILY HISTORY History reviewed. No pertinent family history.  SOCIAL HISTORY Social History   Tobacco Use   Smoking status: Never   Smokeless tobacco: Never  Substance Use Topics   Alcohol use: No   Drug use: No         OPHTHALMIC EXAM:  Base Eye Exam     Visual Acuity (ETDRS)       Right Left   Dist [place] 20/25 20/200   Dist ph [place]  20/150 +1         Tonometry (Tonopen, 9:26 AM)       Right Left   Pressure 17 15         Pupils        Pupils APD   Right PERRL None   Left PERRL None         Visual Fields       Left Right    Full Full         Extraocular Movement       Right Left    Full, Ortho Full, Ortho         Neuro/Psych     Oriented x3: Yes         Dilation     Left eye: 2.5% Phenylephrine, 1.0% Mydriacyl @ 9:26 AM           Slit Lamp and Fundus Exam     External Exam       Right Left   External Normal Normal         Slit Lamp Exam       Right Left   Lids/Lashes Normal 1+ ecchymosis,    Conjunctiva/Sclera White and quiet 1+ Injection, conj well secured, loose vicryl at 5 meridian, touching perpheral cornea, no epi defect   Cornea Clear Clear   Anterior Chamber Deep and quiet Deep and quiet   Iris Round and reactive pharmacological dilated   Lens 1+ Nuclear sclerosis Centered posterior chamber intraocular lens   Anterior Vitreous Normal 50% gas         Fundus Exam       Right Left   Posterior Vitreous  50% gas   Disc  Normal   C/D Ratio  0.3   Macula  macula flat   Vessels  Normal   Periphery  Retina attached posteriorly, good scleral buckle good chorioretinal retinal scarring cryo and laser photocoagulation, no PVR, retina attached completely            IMAGING AND PROCEDURES  Imaging and Procedures for 07/16/22  OCT, Retina - OU - Both Eyes       Right Eye Central Foveal Thickness: 289. Progression has been stable. Findings include normal foveal contour.   Left Eye Central Foveal Thickness: 584. Progression has improved. Findings include cystoid macular edema.   Notes OS macula attached, residual intraretinal fluid and CME remain.  Reviewed with the patient.  Explained the patient that we will deliver periocular steroid injection to penetrate to this area around the eye so as to resolve the central CME OS in a more rapid fashion     Color Fundus Photography Optos - OU - Both Eyes  Right Eye Progression has been stable. Vessels :  normal observations. Periphery : normal observations.   Left Eye Progression has improved. Disc findings include normal observations. Macula : edema. Vessels : normal observations.   Notes Macula on, retina reattached.  50% gas bubble remains.  Good scleral buckle, cryopexy reaction, OS     Injection into Tenon's Capsule - OS - Left Eye       Time Out 07/16/2022. 10:11 AM. Confirmed correct patient, procedure, site, and patient consented.   Anesthesia No anesthesia was used.   Procedure A 27 gauge needle was used.   Injection: 40 mg triamcinolone acetonide 40 MG/ML   Route: Other, Site: Left Eye   NDC: [phone], Lot: [id number], Expiration date: 03/10/2023   Notes OS, Kenalog 40 mg, 1.0 cc injected in a retroseptal fashion via trans septal injection inferotemporal OS with 27-gauge needle, straight back, no angling towards the globe or the cone.             ASSESSMENT/PLAN:  Cystoid macular edema of left eye Residual CME from macula off detachment, reviewed with patient.  See now on OCT as gas bubble resolves.  Will deliver retroseptal Kenalog 40 mg to allow for improvement of CME over the coming weeks.  New partial retinal detachment of left eye with single defect OS looks great, retina attached.     ICD-10-CM   1. Cystoid macular edema of left eye  H35.352 Injection into Tenon's Capsule - OS - Left Eye    triamcinolone acetonide (KENALOG-40) injection 40 mg    2. Secondary glaucoma due to combination mechanisms, left, moderate stage  H40.52X2 OCT, Retina - OU - Both Eyes    Color Fundus Photography Optos - OU - Both Eyes    3. New partial retinal detachment of left eye with single defect  H33.012       OS looks great, retina attached.  Secondary glaucoma likely steroid-induced also improved on current therapy.  2.  Pain is subsided, patient now off atropine for 3 days.  Continue use as needed  3.  OS with residual CME from macula off detachment, will add  periocular retroseptal Kenalog today to control the CME and follow-up in 3 weeks  Ophthalmic Meds Ordered this visit:  Meds ordered this encounter  Medications   triamcinolone acetonide (KENALOG-40) injection 40 mg       Return in about 3 weeks (around 08/06/2022) for dilate, OS, COLOR FP, OCT.  Patient Instructions  Continue on timolol 1 drop left eye twice daily  Continue on latanoprost 1 drop left eye once nightly.  May continues atropine as needed  Pred forte 1 drop left eye once every other day may continue for 2 more weeks   Explained the diagnoses, plan, and follow up with the patient and they expressed understanding.  Patient expressed understanding of the importance of proper follow up care.   Alford Highland Marie Borowski M.D. Diseases & Surgery of the Retina and Vitreous [facility] 07/16/22     Abbreviations: M myopia (nearsighted); A astigmatism; H hyperopia (farsighted); P presbyopia; Mrx spectacle prescription;  CTL contact lenses; OD right eye; OS left eye; OU both eyes  XT exotropia; ET esotropia; PEK punctate epithelial keratitis; PEE punctate epithelial erosions; DES dry eye syndrome; MGD meibomian gland dysfunction; ATs artificial tears; PFAT's preservative free artificial tears; NSC nuclear sclerotic cataract; PSC posterior subcapsular cataract; ERM epi-retinal membrane; PVD posterior vitreous detachment; RD retinal detachment; DM diabetes mellitus; DR diabetic retinopathy; NPDR non-proliferative  diabetic retinopathy; PDR proliferative diabetic retinopathy; CSME clinically significant macular edema; DME diabetic macular edema; dbh dot blot hemorrhages; CWS cotton wool spot; POAG primary open angle glaucoma; C/D cup-to-disc ratio; HVF humphrey visual field; GVF goldmann visual field; OCT optical coherence tomography; IOP intraocular pressure; BRVO Branch retinal vein occlusion; CRVO central retinal vein occlusion; CRAO central retinal artery occlusion; BRAO branch  retinal artery occlusion; RT retinal tear; SB scleral buckle; PPV pars plana vitrectomy; VH Vitreous hemorrhage; PRP panretinal laser photocoagulation; IVK intravitreal kenalog; VMT vitreomacular traction; MH Macular hole;  NVD neovascularization of the disc; NVE neovascularization elsewhere; AREDS age related eye disease study; ARMD age related macular degeneration; POAG primary open angle glaucoma; EBMD epithelial/anterior basement membrane dystrophy; ACIOL anterior chamber intraocular lens; IOL intraocular lens; PCIOL posterior chamber intraocular lens; Phaco/IOL phacoemulsification with intraocular lens placement; PRK photorefractive keratectomy; LASIK laser assisted in situ keratomileusis; HTN hypertension; DM diabetes mellitus; COPD chronic obstructive pulmonary disease

## 2022-07-17 ENCOUNTER — Telehealth (INDEPENDENT_AMBULATORY_CARE_PROVIDER_SITE_OTHER): Payer: Self-pay

## 2022-07-17 NOTE — Telephone Encounter (Signed)
Pt left a vm in regard to post-injection from previous appointment with Dr. Luciana Axe- Pt stated eye was swollen and throbbing.  I called the pt back after speaking to Dr. Luciana Axe about pts ongoing symptoms. Pt was advised to take tylenol as needed and to resume Atropine eye drops. Pt told me that she needed Atropine to be refilled. I called the pharmacy and informed the pt that the drops have been refilled at her local pharmacy.

## 2022-07-25 ENCOUNTER — Other Ambulatory Visit (INDEPENDENT_AMBULATORY_CARE_PROVIDER_SITE_OTHER): Payer: Self-pay | Admitting: Ophthalmology

## 2022-08-07 ENCOUNTER — Encounter (INDEPENDENT_AMBULATORY_CARE_PROVIDER_SITE_OTHER): Payer: Self-pay | Admitting: Ophthalmology

## 2022-08-07 ENCOUNTER — Ambulatory Visit (INDEPENDENT_AMBULATORY_CARE_PROVIDER_SITE_OTHER): Payer: BC Managed Care – PPO | Admitting: Ophthalmology

## 2022-08-07 DIAGNOSIS — H35352 Cystoid macular degeneration, left eye: Secondary | ICD-10-CM | POA: Diagnosis not present

## 2022-08-07 DIAGNOSIS — H4052X2 Glaucoma secondary to other eye disorders, left eye, moderate stage: Secondary | ICD-10-CM | POA: Diagnosis not present

## 2022-08-07 DIAGNOSIS — H33012 Retinal detachment with single break, left eye: Secondary | ICD-10-CM

## 2022-08-07 MED ORDER — DORZOLAMIDE HCL 2 % OP SOLN: 1.0000 [drp] | Freq: Three times a day (TID) | OPHTHALMIC | 1 refills | Status: AC

## 2022-08-07 MED ORDER — KETOROLAC TROMETHAMINE 0.5 % OP SOLN: 1.0000 [drp] | Freq: Three times a day (TID) | OPHTHALMIC | 0 refills | Status: AC

## 2022-08-07 NOTE — Assessment & Plan Note (Signed)
OS looks great retina attached.

## 2022-08-07 NOTE — Assessment & Plan Note (Signed)
OS improved and stable.  We will can discontinue latanoprost and timolol left eye.  We will add and maintain dorzolamide, (orange top) mild lowering of intraocular pressure but also for and hopefully enhancement in inducement of RPE pump in the macular region

## 2022-08-07 NOTE — Progress Notes (Signed)
08/07/2022     CHIEF COMPLAINT Patient presents for  Chief Complaint  Patient presents with   Cystoid Macular Edema      HISTORY OF PRESENT ILLNESS: Veronica Bailey is a 56 y.o. female who presents to the clinic today for:   HPI   3 weeks for DILATE OS, COLOR FP, OCT. Pt stated vision has improved since last visit. Pt reports, "I can still see the bubble. Its been about 8 weeks now and its making my vision distorted and wavy." Pt confirms floaters in OS but denies FOL. Pt stated pt is still taking Latanoprost- 1 drop into both eyes at bedtime DORZ- 1 drop into left eye 3 times daily Pt stated no longer taking atropine for 3 weeks now.   Last edited by Angeline Slim on 08/07/2022  9:11 AM.      Referring physician: Johny Blamer, MD [phone] [address] [place],  [place] [address]  HISTORICAL INFORMATION:   Selected notes from the MEDICAL RECORD NUMBER       CURRENT MEDICATIONS: Current Outpatient Medications (Ophthalmic Drugs)  Medication Sig   dorzolamide (TRUSOPT) 2 % ophthalmic solution Place 1 drop into the left eye 3 (three) times daily.   ketorolac (ACULAR) 0.5 % ophthalmic solution Place 1 drop into the left eye 3 (three) times daily.   dorzolamide (TRUSOPT) 2 % ophthalmic solution Place 1 drop into the left eye 3 (three) times daily.   No current facility-administered medications for this visit. (Ophthalmic Drugs)   Current Outpatient Medications (Other)  Medication Sig   buPROPion (WELLBUTRIN XL) 300 MG 24 hr tablet Take 300 mg by mouth daily.   ciprofloxacin (CIPRO) 500 MG tablet Take 1 tablet (500 mg total) by mouth 2 (two) times daily. One po bid x 7 days   clindamycin (CLEOCIN) 300 MG capsule Take 1 capsule (300 mg total) by mouth every 6 (six) hours.   FIBER PO Take 1 tablet by mouth at bedtime.   HYDROcodone-acetaminophen (NORCO/VICODIN) 5-325 MG tablet Take 1-2 tablets by mouth every 6 (six) hours as needed.   levonorgestrel (MIRENA) 20 MCG/24HR  IUD 1 each by Intrauterine route once.   magnesium gluconate (MAGONATE) 500 MG tablet Take 500 mg by mouth every evening.   metroNIDAZOLE (FLAGYL) 500 MG tablet Take 1 tablet (500 mg total) by mouth 3 (three) times daily. One po tid x 7 days   ondansetron (ZOFRAN ODT) 4 MG disintegrating tablet Take 1 tablet (4 mg total) by mouth every 8 (eight) hours as needed for nausea.   rizatriptan (MAXALT) 10 MG tablet Take 10 mg by mouth daily as needed for migraine. May repeat in 2 hours if needed   zolpidem (AMBIEN) 10 MG tablet Take 5 mg by mouth at bedtime.    No current facility-administered medications for this visit. (Other)      REVIEW OF SYSTEMS: ROS   Negative for: Constitutional, Gastrointestinal, Neurological, Skin, Genitourinary, Musculoskeletal, HENT, Endocrine, Cardiovascular, Eyes, Respiratory, Psychiatric, Allergic/Imm, Heme/Lymph Last edited by Angeline Slim on 08/07/2022  9:06 AM.       ALLERGIES Allergies  Allergen Reactions   Benadryl [Diphenhydramine Hcl] Swelling    Swelling of throat   Imitrex [Sumatriptan] Other (See Comments)    palpations    PAST MEDICAL HISTORY Past Medical History:  Diagnosis Date   Diverticulitis    Migraine    Past Surgical History:  Procedure Laterality Date   APPENDECTOMY     BACK SURGERY     CHOLECYSTECTOMY  LAPAROSCOPIC GASTRIC BANDING     LAPAROSCOPIC REVISION OF GASTRIC BAND      FAMILY HISTORY History reviewed. No pertinent family history.  SOCIAL HISTORY Social History   Tobacco Use   Smoking status: Never   Smokeless tobacco: Never  Substance Use Topics   Alcohol use: No   Drug use: No         OPHTHALMIC EXAM:  Base Eye Exam     Visual Acuity (ETDRS)       Right Left   Dist [place] 20/25 20/150 -1   Dist ph [place]  NI         Tonometry (Tonopen, 9:11 AM)       Right Left   Pressure 16 13         Pupils       Dark Shape React   Right 4 Round    Left 6 Irregular Minimal         Neuro/Psych      Oriented x3: Yes         Dilation     Left eye: 1.0% Mydriacyl, 2.5% Phenylephrine @ 9:11 AM           Slit Lamp and Fundus Exam     External Exam       Right Left   External Normal Normal         Slit Lamp Exam       Right Left   Lids/Lashes Normal Normal   Conjunctiva/Sclera White and quiet White and quiet   Cornea Clear Clear   Anterior Chamber Deep and quiet Deep and quiet   Iris Round and reactive pharmacological dilated   Lens 1+ Nuclear sclerosis Centered posterior chamber intraocular lens   Anterior Vitreous Normal 5% gas         Fundus Exam       Right Left   Posterior Vitreous  5% gas   Disc  Normal   C/D Ratio  0.3   Macula  macula flat, Cystoid macular edema   Vessels  Normal   Periphery  Retina attached posteriorly, good scleral buckle good chorioretinal retinal scarring cryo and laser photocoagulation, no PVR, retina attached completely            IMAGING AND PROCEDURES  Imaging and Procedures for 08/07/22  OCT, Retina - OU - Both Eyes       Right Eye Central Foveal Thickness: 289. Progression has been stable. Findings include normal foveal contour.   Left Eye Central Foveal Thickness: 795. Progression has improved. Findings include cystoid macular edema.   Notes OS macula attached, residual intraretinal fluid and CME remain.  Reviewed with the patient.  Explained the patient that status post recent periocular steroid injection to penetrate to this rarea around the eye so as to resolve the central CME OS in a more rapid fashion however with progression of CME and increasing subretinal fluid suggesting RPE dysfunction     Color Fundus Photography Optos - OU - Both Eyes       Right Eye Progression has been stable. Vessels : normal observations. Periphery : normal observations.   Left Eye Progression has improved. Disc findings include normal observations. Macula : edema. Vessels : normal observations.   Notes Macula on,  retina reattached.  5% gas bubble remains.  Good scleral buckle, cryopexy reaction, OS             ASSESSMENT/PLAN:  Cystoid macular edema of left eye Subfoveal CNVM and subretinal fluid has  increased despite recent retroseptal periocular Kenalog.  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  This could be hypoxic in nature potentially from undiagnosed and untreated sleep apnea or could also be simply post retinal detachment.  We will use topical dorzolamide and attempt to augment the functioning of the RPE  Intraocular pressures of also normalized thus we will discontinue latanoprost on the very small chance that this is triggering worsening of 6 macular CME in the post RD repair milieu.  OS will add ketorolac left eye 3 times daily as well  New partial retinal detachment of left eye with single defect OS looks great retina attached.  Secondary glaucoma due to combination mechanisms, left, moderate stage OS improved and stable.  We will can discontinue latanoprost and timolol left eye.  We will add and maintain dorzolamide, (orange top) mild lowering of intraocular pressure but also for and hopefully enhancement in inducement of RPE pump in the macular region     ICD-10-CM   1. Cystoid macular edema of left eye  H35.352 OCT, Retina - OU - Both Eyes    Color Fundus Photography Optos - OU - Both Eyes    2. New partial retinal detachment of left eye with single defect  H33.012     3. Secondary glaucoma due to combination mechanisms, left, moderate stage  H40.52X2       1.  OS looks great post retinal detachment repair via vitrectomy scleral buckle injection intravitreal gas.  Macular is a attached.  Peripheral retina is attached.  However persistent CME in the center of the vision macular and subretinal fluid.  This is a lingering fluid from the retinal detachment however there is increase in the subretinal fluid suggesting some altering factors particularly in lieu of the recent periocular  retroseptal subtenon's Kenalog delivered to treat CME.  Thus we will discontinue topical latanoprost.  We will also discontinue timolol as intraocular pressures normalized.  We will reinstitute use of dorzolamide left eye 3 times daily.  We will augment the treatment of CME also by using ketorolac 1 drop left eye 3 times daily  2.  Patient is cleared to travel by airplane within 5 days or thereafter  3.  Ophthalmic Meds Ordered this visit:  Meds ordered this encounter  Medications   dorzolamide (TRUSOPT) 2 % ophthalmic solution    Sig: Place 1 drop into the left eye 3 (three) times daily.    Dispense:  10 mL    Refill:  1   ketorolac (ACULAR) 0.5 % ophthalmic solution    Sig: Place 1 drop into the left eye 3 (three) times daily.    Dispense:  5 mL    Refill:  0       Return in about 9 weeks (around 10/09/2022) for dilate, COLOR FP, OCT.  Patient Instructions  Discontinue all topical medications with the exception of the following to the left eye  Dorzolamide (orange top) 1 drop left eye  3 times daily  Ketorolac (gray top) 1 drop left eye 3 times daily-this drop will sting for 10 to 20 seconds after application and that is normal   Explained the diagnoses, plan, and follow up with the patient and they expressed understanding.  Patient expressed understanding of the importance of proper follow up care.   Alford Highland Yulitza Shorts M.D. Diseases & Surgery of the Retina and Vitreous [facility] 08/07/22     Abbreviations: M myopia (nearsighted); A astigmatism; H hyperopia (farsighted); P presbyopia; Mrx spectacle prescription;  CTL contact lenses; OD right eye; OS left eye; OU both eyes  XT exotropia; ET esotropia; PEK punctate epithelial keratitis; PEE punctate epithelial erosions; DES dry eye syndrome; MGD meibomian gland dysfunction; ATs artificial tears; PFAT's preservative free artificial tears; Rew nuclear sclerotic cataract; PSC posterior subcapsular cataract; ERM  epi-retinal membrane; PVD posterior vitreous detachment; RD retinal detachment; DM diabetes mellitus; DR diabetic retinopathy; NPDR non-proliferative diabetic retinopathy; PDR proliferative diabetic retinopathy; CSME clinically significant macular edema; DME diabetic macular edema; dbh dot blot hemorrhages; CWS cotton wool spot; POAG primary open angle glaucoma; C/D cup-to-disc ratio; HVF humphrey visual field; GVF goldmann visual field; OCT optical coherence tomography; IOP intraocular pressure; BRVO Branch retinal vein occlusion; CRVO central retinal vein occlusion; CRAO central retinal artery occlusion; BRAO branch retinal artery occlusion; RT retinal tear; SB scleral buckle; PPV pars plana vitrectomy; VH Vitreous hemorrhage; PRP panretinal laser photocoagulation; IVK intravitreal kenalog; VMT vitreomacular traction; MH Macular hole;  NVD neovascularization of the disc; NVE neovascularization elsewhere; AREDS age related eye disease study; ARMD age related macular degeneration; POAG primary open angle glaucoma; EBMD epithelial/anterior basement membrane dystrophy; ACIOL anterior chamber intraocular lens; IOL intraocular lens; PCIOL posterior chamber intraocular lens; Phaco/IOL phacoemulsification with intraocular lens placement; [place] photorefractive keratectomy; LASIK laser assisted in situ keratomileusis; HTN hypertension; DM diabetes mellitus; COPD chronic obstructive pulmonary disease

## 2022-08-07 NOTE — Patient Instructions (Signed)
Discontinue all topical medications with the exception of the following to the left eye  Dorzolamide (orange top) 1 drop left eye  3 times daily  Ketorolac (gray top) 1 drop left eye 3 times daily-this drop will sting for 10 to 20 seconds after application and that is normal

## 2022-08-07 NOTE — Assessment & Plan Note (Addendum)
Subfoveal CNVM and subretinal fluid has increased despite recent retroseptal periocular Kenalog.  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  This could be hypoxic in nature potentially from undiagnosed and untreated sleep apnea or could also be simply post retinal detachment.  We will use topical dorzolamide and attempt to augment the functioning of the RPE  Intraocular pressures of also normalized thus we will discontinue latanoprost on the very small chance that this is triggering worsening of 6 macular CME in the post RD repair milieu.  OS will add ketorolac left eye 3 times daily as well

## 2022-09-18 ENCOUNTER — Ambulatory Visit
Admission: RE | Admit: 2022-09-18 | Discharge: 2022-09-18 | Disposition: A | Discharge status: Home / Self Care | Payer: BC Managed Care – PPO | Source: Ambulatory Visit

## 2022-09-18 VITALS — BP 156/103 | HR 61 | Temp 99.4°F | Resp 18 | Ht 64.0 in | Wt 167.0 lb

## 2022-09-18 DIAGNOSIS — J309 Allergic rhinitis, unspecified: Secondary | ICD-10-CM

## 2022-09-18 DIAGNOSIS — R059 Cough, unspecified: Secondary | ICD-10-CM | POA: Diagnosis not present

## 2022-09-18 DIAGNOSIS — J0100 Acute maxillary sinusitis, unspecified: Secondary | ICD-10-CM

## 2022-09-18 MED ORDER — AZITHROMYCIN 250 MG PO TABS: 250.0000 mg | ORAL_TABLET | Freq: Every day | ORAL | 0 refills | Status: AC

## 2022-09-18 MED ORDER — PROMETHAZINE-DM 6.25-15 MG/5ML PO SYRP: 5.0000 mL | ORAL_SOLUTION | Freq: Two times a day (BID) | ORAL | 0 refills | Status: AC | PRN

## 2022-09-18 MED ORDER — FEXOFENADINE HCL 180 MG PO TABS: 180.0000 mg | ORAL_TABLET | Freq: Every day | ORAL | 0 refills | Status: AC

## 2022-09-18 MED ORDER — BENZONATATE 200 MG PO CAPS: 200.0000 mg | ORAL_CAPSULE | Freq: Three times a day (TID) | ORAL | 0 refills | Status: AC | PRN

## 2022-09-18 NOTE — ED Triage Notes (Signed)
Patient c/o chest congestion, wheezing, non-productive cough, low grade fever x 3 days.  Home COVID test was negative.  Patient has taken Delsym, Ibuprofen and Tylenol.

## 2022-09-18 NOTE — Discharge Instructions (Addendum)
Instructed patient to take medication as directed with food to completion.  Advised patient to take Allegra with Zithromax daily for the next 5 days.  Advised may use Allegra as needed afterwards for concurrent postnasal drainage/drip.  Advised may use Tessalon Perles daily or as needed for cough.  Advised may use Promethazine DM prior to sleep for cough due to sedate of affect.  Advised not to use Tessalon and Promethazine DM together.  Encouraged patient to increase daily water intake to 64 ounces per day while taking these medications.  Advised if symptoms worsen and/or unresolved please follow-up with PCP or here for further evaluation.

## 2022-09-18 NOTE — ED Provider Notes (Signed)
[facility]    CSN: [id number] Arrival date & time: 09/18/22  0854      History   Chief Complaint Chief Complaint  Patient presents with   Cough    Wheezing, coughing not controlled - Entered by patient   Appointment    HPI Veronica Bailey is a 56 y.o. female.   HPI Pleasant 56 year old female presents with chest congestion, wheezing, nonproductive cough and low-grade fever for 3 days.  Reports recent home COVID-19 test was negative.  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  Past Medical History:  Diagnosis Date   Diverticulitis    Migraine     Patient Active Problem List   Diagnosis Date Noted   Cystoid macular edema of left eye 07/16/2022   Steroid induced glaucoma, left eye 06/18/2022   Secondary glaucoma due to combination mechanisms, left, moderate stage 06/13/2022   Ciliary body spasm, left 06/10/2022   New partial retinal detachment of left eye with single defect 06/06/2022   Nuclear sclerotic cataract of right eye 06/06/2022   Pseudophakia of left eye 06/06/2022    Past Surgical History:  Procedure Laterality Date   APPENDECTOMY     BACK SURGERY     CHOLECYSTECTOMY     LAPAROSCOPIC GASTRIC BANDING     LAPAROSCOPIC REVISION OF GASTRIC BAND      OB History   No obstetric history on file.      Home Medications    Prior to Admission medications   Medication Sig Start Date End Date Taking? Authorizing Provider  azithromycin (ZITHROMAX) 250 MG tablet Take 1 tablet (250 mg total) by mouth daily. Take first 2 tablets together, then 1 every day until finished. 09/18/22  Yes Trevor Iha, FNP  benzonatate (TESSALON) 200 MG capsule Take 1 capsule (200 mg total) by mouth 3 (three) times daily as needed for up to 7 days. 09/18/22 09/25/22 Yes Trevor Iha, FNP  buPROPion (WELLBUTRIN XL) 300 MG  24 hr tablet Take 300 mg by mouth daily.   Yes [provider]  dorzolamide (TRUSOPT) 2 % ophthalmic solution Place 1 drop into the left eye 3 (three) times daily. 06/13/22 06/13/23 Yes Rankin, Alford Highland, MD  dorzolamide (TRUSOPT) 2 % ophthalmic solution Place 1 drop into the left eye 3 (three) times daily. 08/07/22 08/07/23 Yes Rankin, Alford Highland, MD  fexofenadine [facility] ALLERGY) 180 MG tablet Take 1 tablet (180 mg total) by mouth daily for 15 days. 09/18/22 10/03/22 Yes Trevor Iha, FNP  ketorolac (ACULAR) 0.5 % ophthalmic solution Place 1 drop into the left eye 3 (three) times daily. 08/07/22 08/07/23 Yes Rankin, Alford Highland, MD  ondansetron (ZOFRAN ODT) 4 MG disintegrating tablet Take 1 tablet (4 mg total) by mouth every 8 (eight) hours as needed for nausea. 10/09/16  Yes Rolland Porter, MD  promethazine-dextromethorphan (PROMETHAZINE-DM) 6.25-15 MG/5ML syrup Take 5 mLs by mouth 2 (two) times daily as needed for cough. 09/18/22  Yes Trevor Iha, FNP  rizatriptan (MAXALT) 10 MG tablet Take 10 mg by mouth daily as needed for migraine. May repeat in 2 hours if needed   Yes [provider]  sertraline (ZOLOFT) 50 MG tablet Take 1 tablet every day by oral route. 07/20/21  Yes [provider]  zolpidem (AMBIEN) 10 MG tablet Take 5 mg by mouth at bedtime.   Yes [provider]  ciprofloxacin (CIPRO) 500 MG tablet Take  1 tablet (500 mg total) by mouth 2 (two) times daily. One po bid x 7 days 03/24/17   Veryl Speak, MD  clindamycin (CLEOCIN) 300 MG capsule Take 1 capsule (300 mg total) by mouth every 6 (six) hours. 10/09/16   Tanna Furry, MD  FIBER PO Take 1 tablet by mouth at bedtime.    [provider]  HYDROcodone-acetaminophen (NORCO/VICODIN) 5-325 MG tablet Take 1-2 tablets by mouth every 6 (six) hours as needed. 03/24/17   Veryl Speak, MD  levonorgestrel (MIRENA) 20 MCG/24HR IUD 1 each by Intrauterine route once.    [provider]  magnesium gluconate (MAGONATE)  500 MG tablet Take 500 mg by mouth every evening.    [provider]  metroNIDAZOLE (FLAGYL) 500 MG tablet Take 1 tablet (500 mg total) by mouth 3 (three) times daily. One po tid x 7 days 03/24/17   Veryl Speak, MD    Family History History reviewed. No pertinent family history.  Social History Social History   Tobacco Use   Smoking status: Never   Smokeless tobacco: Never  Substance Use Topics   Alcohol use: No   Drug use: No     Allergies   Benadryl [diphenhydramine hcl] and Imitrex [sumatriptan]   Review of Systems Review of Systems  Respiratory:  Positive for cough.   All other systems reviewed and are negative.    Physical Exam Triage Vital Signs ED Triage Vitals  Enc Vitals Group     BP 09/18/22 0911 (!) 156/103     Pulse Rate 09/18/22 0911 61     Resp 09/18/22 0911 18     Temp 09/18/22 0911 99.4 F (37.4 C)     Temp Source 09/18/22 0911 Oral     SpO2 09/18/22 0911 98 %     Weight 09/18/22 0913 167 lb (75.8 kg)     Height 09/18/22 0913 5\' 4"  (1.626 m)     Head Circumference --      Peak Flow --      Pain Score 09/18/22 0913 0     Pain Loc --      Pain Edu? --      Excl. in [place]? --    No data found.  Updated Vital Signs BP (!) 156/103 (BP Location: Right Arm)   Pulse 61   Temp 99.4 F (37.4 C) (Oral)   Resp 18   Ht 5\' 4"  (1.626 m)   Wt 167 lb (75.8 kg)   LMP 01/19/2013   SpO2 98%   BMI 28.67 kg/m    Physical Exam Vitals and nursing note reviewed.  Constitutional:      General: She is not in acute distress.    Appearance: Normal appearance. She is obese. She is not ill-appearing.  HENT:     Head: Normocephalic and atraumatic.     Right Ear: Tympanic membrane, ear canal and external ear normal.     Left Ear: Tympanic membrane, ear canal and external ear normal.     Mouth/Throat:     Mouth: Mucous membranes are moist.     Pharynx: Oropharynx is clear.     Comments: Significant amount of clear drainage of posterior oropharynx  noted Eyes:     Extraocular Movements: Extraocular movements intact.     Conjunctiva/sclera: Conjunctivae normal.     Pupils: Pupils are equal, round, and reactive to light.  Cardiovascular:     Rate and Rhythm: Normal rate and regular rhythm.     Pulses: Normal pulses.  Heart sounds: Normal heart sounds.  Pulmonary:     Effort: Pulmonary effort is normal.     Breath sounds: Normal breath sounds. No wheezing, rhonchi or rales.     Comments: Infrequent nonproductive cough noted on exam Musculoskeletal:     Cervical back: Normal range of motion and neck supple.  Skin:    General: Skin is warm and dry.  Neurological:     General: No focal deficit present.     Mental Status: She is alert and oriented to person, place, and time.      UC Treatments / Results  Labs (all labs ordered are listed, but only abnormal results are displayed) Labs Reviewed - No data to display  EKG   Radiology No results found.  Procedures Procedures (including critical care time)  Medications Ordered in UC Medications - No data to display  Initial Impression / Assessment and Plan / UC Course  I have reviewed the triage vital signs and the nursing notes.  Pertinent labs & imaging results that were available during my care of the patient were reviewed by me and considered in my medical decision making (see chart for details).     MDM: 1.  Subacute maxillary sinusitis-Rx'd Zithromax; 2.  Cough-Rx'd Tessalon, Promethazine DM; 3.  Allergic rhinitis-Rx'd Allegra. Instructed patient to take medication as directed with food to completion.  Advised patient to take Allegra with Zithromax daily for the next 5 days.  Advised may use Allegra as needed afterwards for concurrent postnasal drainage/drip.  Advised may use Tessalon Perles daily or as needed for cough.  Advised may use Promethazine DM prior to sleep for cough due to sedate of affect.  Advised not to use Tessalon and Promethazine DM together.   Encouraged patient to increase daily water intake to 64 ounces per day while taking these medications.  Advised if symptoms worsen and/or unresolved please follow-up with PCP or here for further evaluation.  Patient discharged home, hemodynamically stable. Final Clinical Impressions(s) / UC Diagnoses   Final diagnoses:  Cough, unspecified type  Subacute maxillary sinusitis  Allergic rhinitis, unspecified seasonality, unspecified trigger     Discharge Instructions      Instructed patient to take medication as directed with food to completion.  Advised patient to take Allegra with Zithromax daily for the next 5 days.  Advised may use Allegra as needed afterwards for concurrent postnasal drainage/drip.  Advised may use Tessalon Perles daily or as needed for cough.  Advised may use Promethazine DM prior to sleep for cough due to sedate of affect.  Advised not to use Tessalon and Promethazine DM together.  Encouraged patient to increase daily water intake to 64 ounces per day while taking these medications.  Advised if symptoms worsen and/or unresolved please follow-up with PCP or here for further evaluation.     ED Prescriptions     Medication Sig Dispense Auth. Provider   azithromycin (ZITHROMAX) 250 MG tablet Take 1 tablet (250 mg total) by mouth daily. Take first 2 tablets together, then 1 every day until finished. 6 tablet Trevor Iha, FNP   fexofenadine [facility] ALLERGY) 180 MG tablet Take 1 tablet (180 mg total) by mouth daily for 15 days. 15 tablet Trevor Iha, FNP   benzonatate (TESSALON) 200 MG capsule Take 1 capsule (200 mg total) by mouth 3 (three) times daily as needed for up to 7 days. 40 capsule Trevor Iha, FNP   promethazine-dextromethorphan (PROMETHAZINE-DM) 6.25-15 MG/5ML syrup Take 5 mLs by mouth 2 (two) times daily  as needed for cough. 118 mL Trevor Iha, FNP      PDMP not reviewed this encounter.   Trevor Iha, FNP 09/18/22 1011

## 2022-09-26 DIAGNOSIS — Z1231 Encounter for screening mammogram for malignant neoplasm of breast: Secondary | ICD-10-CM | POA: Diagnosis not present

## 2022-10-08 DIAGNOSIS — H43821 Vitreomacular adhesion, right eye: Secondary | ICD-10-CM | POA: Diagnosis not present

## 2022-10-08 DIAGNOSIS — H35352 Cystoid macular degeneration, left eye: Secondary | ICD-10-CM | POA: Diagnosis not present

## 2022-10-09 ENCOUNTER — Encounter (INDEPENDENT_AMBULATORY_CARE_PROVIDER_SITE_OTHER): Payer: BC Managed Care – PPO | Admitting: Ophthalmology

## 2022-11-18 DIAGNOSIS — H43821 Vitreomacular adhesion, right eye: Secondary | ICD-10-CM | POA: Diagnosis not present

## 2022-11-18 DIAGNOSIS — H30042 Focal chorioretinal inflammation, macular or paramacular, left eye: Secondary | ICD-10-CM | POA: Diagnosis not present

## 2022-11-25 DIAGNOSIS — M9901 Segmental and somatic dysfunction of cervical region: Secondary | ICD-10-CM | POA: Diagnosis not present

## 2022-11-25 DIAGNOSIS — M9906 Segmental and somatic dysfunction of lower extremity: Secondary | ICD-10-CM | POA: Diagnosis not present

## 2022-11-25 DIAGNOSIS — M9903 Segmental and somatic dysfunction of lumbar region: Secondary | ICD-10-CM | POA: Diagnosis not present

## 2022-11-25 DIAGNOSIS — M26602 Left temporomandibular joint disorder, unspecified: Secondary | ICD-10-CM | POA: Diagnosis not present

## 2022-12-16 DIAGNOSIS — H6692 Otitis media, unspecified, left ear: Secondary | ICD-10-CM | POA: Diagnosis not present

## 2022-12-16 DIAGNOSIS — J019 Acute sinusitis, unspecified: Secondary | ICD-10-CM | POA: Diagnosis not present

## 2023-01-06 DIAGNOSIS — H43821 Vitreomacular adhesion, right eye: Secondary | ICD-10-CM | POA: Diagnosis not present

## 2023-01-06 DIAGNOSIS — H30042 Focal chorioretinal inflammation, macular or paramacular, left eye: Secondary | ICD-10-CM | POA: Diagnosis not present

## 2023-09-08 ENCOUNTER — Ambulatory Visit: Payer: BC Managed Care – PPO | Attending: Adult Medicine

## 2023-09-08 DIAGNOSIS — R42 Dizziness and giddiness: Secondary | ICD-10-CM | POA: Insufficient documentation

## 2023-09-08 DIAGNOSIS — R2681 Unsteadiness on feet: Secondary | ICD-10-CM | POA: Insufficient documentation

## 2023-09-08 NOTE — Therapy (Signed)
OUTPATIENT PHYSICAL THERAPY VESTIBULAR EVALUATION     Patient Name: Veronica Bailey MRN: [id number] DOB:06/29/1966, 57 y.o., female Today's Date: 09/08/2023  END OF SESSION:  PT End of Session - 09/08/23 1403     Visit Number 1    Number of Visits 9    Date for PT Re-Evaluation 10/10/23    Authorization Type BCBS    PT Start Time 1401    PT Stop Time 1445    PT Time Calculation (min) 44 min    Activity Tolerance Patient tolerated treatment well    Behavior During Therapy WFL for tasks assessed/performed             Past Medical History:  Diagnosis Date   Diverticulitis    Migraine    Past Surgical History:  Procedure Laterality Date   APPENDECTOMY     BACK SURGERY     CHOLECYSTECTOMY     LAPAROSCOPIC GASTRIC BANDING     LAPAROSCOPIC REVISION OF GASTRIC BAND     Patient Active Problem List   Diagnosis Date Noted   Cystoid macular edema of left eye 07/16/2022   Steroid induced glaucoma, left eye 06/18/2022   Secondary glaucoma due to combination mechanisms, left, moderate stage 06/13/2022   Ciliary body spasm, left 06/10/2022   New partial retinal detachment of left eye with single defect 06/06/2022   Nuclear sclerotic cataract of right eye 06/06/2022   Pseudophakia of left eye 06/06/2022    PCP: Johny Blamer, MD REFERRING PROVIDER: Glory Buff, MD  REFERRING DIAG: R42 (ICD-10-CM) - Dizziness and giddiness   THERAPY DIAG:  Unsteadiness on feet - Plan: PT plan of care cert/re-cert  Dizziness and giddiness - Plan: PT plan of care cert/re-cert  ONSET DATE:   08/27/2023  referral  Rationale for Evaluation and Treatment: Rehabilitation  SUBJECTIVE:   SUBJECTIVE STATEMENT: Patient arrives to PT alone, no AD. 2-3 weeks ago was at [employer] and started having episodes of spinning. First weekend had pretty consistent spinning regardless of motion, now it's more with laying back and turning to the L and looking down.  Pt accompanied by:  self  PERTINENT HISTORY: migraine, diverticulitis, L eye retina detachment   PAIN:  Are you having pain? No  PRECAUTIONS: Fall  WEIGHT BEARING RESTRICTIONS: No  FALLS: Has patient fallen in last 6 months? No  LIVING ENVIRONMENT: Lives with: lives with their spouse Lives in: House/apartment Stairs: Yes: External: a few steps; on right going up Has following equipment at home: None  PLOF: Independent  PATIENT GOALS: "to not be dizzy"  OBJECTIVE:  Note: Objective measures were completed at Evaluation unless otherwise noted.  DIAGNOSTIC FINDINGS: non contributory   COGNITION: Overall cognitive status: Within functional limits for tasks assessed   POSTURE:  No Significant postural limitations  Cervical ROM:   WFL and painfree  STRENGTH: WFL  BED MOBILITY:  Independent, does report dizziness with rolling L or sitting up fast   GAIT: Gait pattern: WFL Distance walked: clinic  PATIENT SURVEYS:  FOTO did not capture  VESTIBULAR ASSESSMENT:  GENERAL OBSERVATION: NAD, no AD   SYMPTOM BEHAVIOR:  Subjective history: see above  Non-Vestibular symptoms: changes in vision, headaches, and nausea/vomiting vision "goes black" the first few seconds  Type of dizziness: Spinning/Vertigo clockwise   Frequency: "pretty constant"   Duration: a couple seconds (stop doing the activity)  Aggravating factors: Spontaneous, Induced by position change: lying supine, rolling to the left, and supine to sit, Induced by motion: occur when walking, bending down  to the ground, turning body quickly, and turning head quickly, and Occurs when standing still   Relieving factors: head stationary, rest, and slow movements  Progression of symptoms: unchanged  OCULOMOTOR EXAM:  Ocular Alignment: normal  Ocular ROM: No Limitations  Spontaneous Nystagmus: absent  Gaze-Induced Nystagmus: absent  Smooth Pursuits: intact  Saccades: intact  Convergence/Divergence: <5 cm   VESTIBULAR - OCULAR  REFLEX:   Slow VOR: Normal  VOR Cancellation: Normal  Head-Impulse Test: HIT Right: negative HIT Left: negative   POSITIONAL TESTING: Right Dix-Hallpike: no nystagmus Left Dix-Hallpike: upbeating, left nystagmus Right Roll Test: no nystagmus Left Roll Test: no nystagmus  MOTION SENSITIVITY:  Motion Sensitivity Quotient Intensity: 0 = none, 1 = Lightheaded, 2 = Mild, 3 = Moderate, 4 = Severe, 5 = Vomiting  Intensity  1. Sitting to supine   2. Supine to L side   3. Supine to R side   4. Supine to sitting   5. L Hallpike-Dix 4  6. Up from L  4  7. R Hallpike-Dix 0  8. Up from R  1  9. Sitting, head tipped to L knee   10. Head up from L knee   11. Sitting, head tipped to R knee   12. Head up from R knee   13. Sitting head turns x5   14.Sitting head nods x5   15. In stance, 180 turn to L    16. In stance, 180 turn to R      VESTIBULAR TREATMENT:                                                                                                    Canalith Repositioning:  Epley Left: Number of Reps: 2 and Response to Treatment: symptoms improved   PATIENT EDUCATION: Education details: PT POC, BPPV pathophys, self-epley PRN Person educated: Patient Education method: Explanation and Handouts Education comprehension: verbalized understanding and needs further education  HOME EXERCISE PROGRAM:  GOALS: Goals reviewed with patient? Yes  SHORT TERM GOALS: = LTG based on PT POC length   LONG TERM GOALS: Target date: 10/10/23  Pt will be independent with final HEP for improved symptoms  Baseline: to be provided Goal status: INITIAL  2.  Patient will demonstrate (-) positional testing to indicate resolution of BPPV  Baseline: (+) L posterior canal canalithiasis  Goal status: INITIAL  3.  MSQ goal Baseline: to be completed Goal status: INITIAL  4.  FOTO goal Baseline: to be completed  Goal status: INITIAL    ASSESSMENT:  CLINICAL IMPRESSION: Patient is a 57  y.o. female who was seen today for physical therapy evaluation and treatment for dizziness. Exam consistent with L posterior canal canalithiasis. ~3s delay and L torsional upbeating nystagmus lasting ~15s. She tolerated Epley well with improved symptom reports. Did have slight onset of nausea, but no emesis. She would benefit form skilled PT to address the above mentioned deficits.   OBJECTIVE IMPAIRMENTS: dizziness.   ACTIVITY LIMITATIONS: sleeping, stairs, bed mobility, locomotion level, and caring for others  PARTICIPATION LIMITATIONS: interpersonal relationship, community activity, occupation, and yard  work  PERSONAL FACTORS: Age, Past/current experiences, Sex, Time since onset of injury/illness/exacerbation, Transportation, and 1-2 comorbidities: see above  are also affecting patient's functional outcome.   REHAB POTENTIAL: Good  CLINICAL DECISION MAKING: Stable/uncomplicated  EVALUATION COMPLEXITY: Low   PLAN:  PT FREQUENCY: 2x/week  PT DURATION: 4 weeks  PLANNED INTERVENTIONS: Therapeutic exercises, Therapeutic activity, Neuromuscular re-education, Balance training, Gait training, Patient/Family education, Self Care, Joint mobilization, Stair training, Vestibular training, Canalith repositioning, Visual/preceptual remediation/compensation, DME instructions, Aquatic Therapy, Manual therapy, and Re-evaluation  PLAN FOR NEXT SESSION: reassess L posterior canal, HEP (brandt daroff), MSQ, FOTO   Westley Foots, PT Westley Foots, PT, DPT, CBIS  09/08/2023, 3:31 PM

## 2023-09-10 ENCOUNTER — Ambulatory Visit: Payer: BC Managed Care – PPO | Attending: Adult Medicine

## 2023-09-10 DIAGNOSIS — R2681 Unsteadiness on feet: Secondary | ICD-10-CM

## 2023-09-10 DIAGNOSIS — R42 Dizziness and giddiness: Secondary | ICD-10-CM | POA: Diagnosis present

## 2023-09-10 NOTE — Therapy (Signed)
OUTPATIENT PHYSICAL THERAPY VESTIBULAR EVALUATION     Patient Name: Veronica Bailey MRN: [id number] DOB:30-Aug-1966, 57 y.o., female Today's Date: 09/10/2023  END OF SESSION:  PT End of Session - 09/10/23 0802     Visit Number 2    Number of Visits 12   re-cert   Date for PT Re-Evaluation 10/10/23    Authorization Type BCBS    PT Start Time 0800    PT Stop Time 0845    PT Time Calculation (min) 45 min    Activity Tolerance Patient tolerated treatment well    Behavior During Therapy WFL for tasks assessed/performed             Past Medical History:  Diagnosis Date   Diverticulitis    Migraine    Past Surgical History:  Procedure Laterality Date   APPENDECTOMY     BACK SURGERY     CHOLECYSTECTOMY     LAPAROSCOPIC GASTRIC BANDING     LAPAROSCOPIC REVISION OF GASTRIC BAND     Patient Active Problem List   Diagnosis Date Noted   Cystoid macular edema of left eye 07/16/2022   Steroid induced glaucoma, left eye 06/18/2022   Secondary glaucoma due to combination mechanisms, left, moderate stage 06/13/2022   Ciliary body spasm, left 06/10/2022   New partial retinal detachment of left eye with single defect 06/06/2022   Nuclear sclerotic cataract of right eye 06/06/2022   Pseudophakia of left eye 06/06/2022    PCP: Johny Blamer, MD REFERRING PROVIDER: Glory Buff, MD  REFERRING DIAG: R42 (ICD-10-CM) - Dizziness and giddiness   THERAPY DIAG:  Unsteadiness on feet - Plan: PT plan of care cert/re-cert  Dizziness and giddiness - Plan: PT plan of care cert/re-cert  ONSET DATE:   08/27/2023  referral  Rationale for Evaluation and Treatment: Rehabilitation  SUBJECTIVE:   SUBJECTIVE STATEMENT: Patient arrives to PT alone, no AD. 2-3 weeks ago was at [employer] and started having episodes of spinning. First weekend had pretty consistent spinning regardless of motion, now it's more with laying back and turning to the L and looking down.  Pt accompanied by:  self  PERTINENT HISTORY: migraine, diverticulitis, L eye retina detachment   PAIN:  Are you having pain? No  PRECAUTIONS: Fall   PATIENT GOALS: "to not be dizzy"  VESTIBULAR ASSESSMENT:   POSITIONAL TESTING: Right Dix-Hallpike: no nystagmus Left Dix-Hallpike: upbeating, left nystagmus Right Roll Test: no nystagmus Left Roll Test: no nystagmus  VESTIBULAR TREATMENT:                                                                                                    Canalith Repositioning:  Epley Left: Number of Reps: 3 and Response to Treatment: symptoms improved  [place] to the L x2 with onset of spinning dizziness   -completed trendelenburg epley x1 again with improved symptoms   PATIENT EDUCATION: Education details: PT POC, BPPV pathophys, self-epley PRN, brandt daroff Person educated: Patient Education method: Explanation and Handouts Education comprehension: verbalized understanding and needs further education  HOME EXERCISE PROGRAM:  GOALS: Goals reviewed with patient?  Yes  SHORT TERM GOALS: = LTG based on PT POC length   LONG TERM GOALS: Target date: 10/10/23  Pt will be independent with final HEP for improved symptoms  Baseline: to be provided Goal status: INITIAL  2.  Patient will demonstrate (-) positional testing to indicate resolution of BPPV  Baseline: (+) L posterior canal canalithiasis  Goal status: INITIAL  3.  MSQ goal Baseline: to be completed Goal status: INITIAL  4.  FOTO goal Baseline: to be completed  Goal status: INITIAL    ASSESSMENT:  CLINICAL IMPRESSION: Patient seen for skilled PT session with emphasis on BPPV reassessment and treatment. Presents with L posterior canal canalithiasis again. Treated with multiple rounds of Epley with bed in trendelenburg position. Tolerated well. Instructed to complete brandt daroff exercises starting tomorrow and return Friday for re-check of BPPV and tx PRN.   OBJECTIVE IMPAIRMENTS:  dizziness.   ACTIVITY LIMITATIONS: sleeping, stairs, bed mobility, locomotion level, and caring for others  PARTICIPATION LIMITATIONS: interpersonal relationship, community activity, occupation, and yard work  PERSONAL FACTORS: Age, Past/current experiences, Sex, Time since onset of injury/illness/exacerbation, Transportation, and 1-2 comorbidities: see above  are also affecting patient's functional outcome.   REHAB POTENTIAL: Good  CLINICAL DECISION MAKING: Stable/uncomplicated  EVALUATION COMPLEXITY: Low   PLAN:  PT FREQUENCY: 2x/week 3x a week (re-cert)  PT DURATION: 4 weeks  PLANNED INTERVENTIONS: Therapeutic exercises, Therapeutic activity, Neuromuscular re-education, Balance training, Gait training, Patient/Family education, Self Care, Joint mobilization, Stair training, Vestibular training, Canalith repositioning, Visual/preceptual remediation/compensation, DME instructions, Aquatic Therapy, Manual therapy, and Re-evaluation  PLAN FOR NEXT SESSION: reassess L posterior canal, HEP (brandt daroff), MSQ, FOTO   Westley Foots, PT Westley Foots, PT, DPT, CBIS  09/10/2023, 9:09 AM

## 2023-09-12 ENCOUNTER — Ambulatory Visit: Payer: BC Managed Care – PPO

## 2023-09-12 DIAGNOSIS — R2681 Unsteadiness on feet: Secondary | ICD-10-CM

## 2023-09-12 DIAGNOSIS — R42 Dizziness and giddiness: Secondary | ICD-10-CM

## 2023-09-12 NOTE — Therapy (Signed)
OUTPATIENT PHYSICAL THERAPY VESTIBULAR EVALUATION     Patient Name: Veronica Bailey MRN: [id number] DOB:[birth year], 57 y.o., female Today's Date: 09/12/2023  END OF SESSION:  PT End of Session - 09/12/23 1451     Visit Number 3    Number of Visits 12    Date for PT Re-Evaluation 10/10/23    Authorization Type BCBS    PT Start Time 1449   patient late   PT Stop Time 1509   BPPV tx   PT Time Calculation (min) 20 min    Activity Tolerance Patient tolerated treatment well    Behavior During Therapy WFL for tasks assessed/performed             Past Medical History:  Diagnosis Date   Diverticulitis    Migraine    Past Surgical History:  Procedure Laterality Date   APPENDECTOMY     BACK SURGERY     CHOLECYSTECTOMY     LAPAROSCOPIC GASTRIC BANDING     LAPAROSCOPIC REVISION OF GASTRIC BAND     Patient Active Problem List   Diagnosis Date Noted   Cystoid macular edema of left eye 07/16/2022   Steroid induced glaucoma, left eye 06/18/2022   Secondary glaucoma due to combination mechanisms, left, moderate stage 06/13/2022   Ciliary body spasm, left 06/10/2022   New partial retinal detachment of left eye with single defect 06/06/2022   Nuclear sclerotic cataract of right eye 06/06/2022   Pseudophakia of left eye 06/06/2022    PCP: Johny Blamer, MD REFERRING PROVIDER: Glory Buff, MD  REFERRING DIAG: R42 (ICD-10-CM) - Dizziness and giddiness   THERAPY DIAG:  Unsteadiness on feet  Dizziness and giddiness  ONSET DATE:   08/27/2023  referral  Rationale for Evaluation and Treatment: Rehabilitation  SUBJECTIVE:   SUBJECTIVE STATEMENT: Patient arrives to clinic with daughter. States yesterday was a good day with no dizziness. Today, not feeling as well. But no spinning, just "off." Did start taking a new thyroid rx. Denies falls.  Pt accompanied by: self  PERTINENT HISTORY: migraine, diverticulitis, L eye retina detachment   PAIN:  Are you having pain?  No  PRECAUTIONS: Fall   PATIENT GOALS: "to not be dizzy"  VESTIBULAR ASSESSMENT:   POSITIONAL TESTING: Left Dix-Hallpike: upbeating, left nystagmus and initially no nystagmus on first check, but upon sitting up demonstrated return of cupulolithiasis and so proceeded with tx  VESTIBULAR TREATMENT:                                                                                                    Canalith Repositioning:  Epley Left: Number of Reps: 2 and Response to Treatment: symptoms resolved   Provided patient with note for Pilates indicating that she cannot lay flat nor roll B at this time    PATIENT EDUCATION: Education details: Continue HEP Person educated: Patient Education method: Explanation and Handouts Education comprehension: verbalized understanding and needs further education  HOME EXERCISE PROGRAM:  GOALS: Goals reviewed with patient? Yes  SHORT TERM GOALS: = LTG based on PT POC length   LONG TERM GOALS: Target date:  10/10/23  Pt will be independent with final HEP for improved symptoms  Baseline: to be provided Goal status: INITIAL  2.  Patient will demonstrate (-) positional testing to indicate resolution of BPPV  Baseline: (+) L posterior canal canalithiasis  Goal status: INITIAL  3.  MSQ goal Baseline: to be completed Goal status: INITIAL  4.  FOTO goal Baseline: to be completed  Goal status: INITIAL    ASSESSMENT:  CLINICAL IMPRESSION: Patient seen for skilled PT session with emphasis on canalith repositioning. Initially demonstrated resolution of L posterior canal canalithiasis, but upon sitting up, demonstrated L torsional upbeating nystagmus. Tolerated Epley x2 well with resolution of symptoms. Continue POC.   OBJECTIVE IMPAIRMENTS: dizziness.   ACTIVITY LIMITATIONS: sleeping, stairs, bed mobility, locomotion level, and caring for others  PARTICIPATION LIMITATIONS: interpersonal relationship, community activity, occupation, and yard  work  PERSONAL FACTORS: Age, Past/current experiences, Sex, Time since onset of injury/illness/exacerbation, Transportation, and 1-2 comorbidities: see above  are also affecting patient's functional outcome.   REHAB POTENTIAL: Good  CLINICAL DECISION MAKING: Stable/uncomplicated  EVALUATION COMPLEXITY: Low   PLAN:  PT FREQUENCY: 2x/week 3x a week (re-cert)  PT DURATION: 4 weeks  PLANNED INTERVENTIONS: Therapeutic exercises, Therapeutic activity, Neuromuscular re-education, Balance training, Gait training, Patient/Family education, Self Care, Joint mobilization, Stair training, Vestibular training, Canalith repositioning, Visual/preceptual remediation/compensation, DME instructions, Aquatic Therapy, Manual therapy, and Re-evaluation  PLAN FOR NEXT SESSION: reassess L posterior canal, HEP (brandt daroff), MSQ, FOTO   Westley Foots, PT Westley Foots, PT, DPT, CBIS  09/12/2023, 3:20 PM

## 2023-09-15 ENCOUNTER — Ambulatory Visit: Payer: BC Managed Care – PPO

## 2023-09-15 DIAGNOSIS — R2681 Unsteadiness on feet: Secondary | ICD-10-CM

## 2023-09-15 DIAGNOSIS — R42 Dizziness and giddiness: Secondary | ICD-10-CM

## 2023-09-15 NOTE — Therapy (Signed)
OUTPATIENT PHYSICAL THERAPY VESTIBULAR EVALUATION     Patient Name: Veronica Bailey MRN: [id number] DOB:10/15/1966, 57 y.o., female Today's Date: 09/15/2023  END OF SESSION:  PT End of Session - 09/15/23 1446     Visit Number 4    Number of Visits 12    Date for PT Re-Evaluation 10/10/23    Authorization Type BCBS    PT Start Time 1445    PT Stop Time 1526    PT Time Calculation (min) 41 min    Activity Tolerance Patient tolerated treatment well    Behavior During Therapy WFL for tasks assessed/performed             Past Medical History:  Diagnosis Date   Diverticulitis    Migraine    Past Surgical History:  Procedure Laterality Date   APPENDECTOMY     BACK SURGERY     CHOLECYSTECTOMY     LAPAROSCOPIC GASTRIC BANDING     LAPAROSCOPIC REVISION OF GASTRIC BAND     Patient Active Problem List   Diagnosis Date Noted   Cystoid macular edema of left eye 07/16/2022   Steroid induced glaucoma, left eye 06/18/2022   Secondary glaucoma due to combination mechanisms, left, moderate stage 06/13/2022   Ciliary body spasm, left 06/10/2022   New partial retinal detachment of left eye with single defect 06/06/2022   Nuclear sclerotic cataract of right eye 06/06/2022   Pseudophakia of left eye 06/06/2022    PCP: Johny Blamer, MD REFERRING PROVIDER: Glory Buff, MD  REFERRING DIAG: R42 (ICD-10-CM) - Dizziness and giddiness   THERAPY DIAG:  Unsteadiness on feet  Dizziness and giddiness  ONSET DATE:   08/27/2023  referral  Rationale for Evaluation and Treatment: Rehabilitation  SUBJECTIVE:   SUBJECTIVE STATEMENT: Patient arrives to clinic with husband. Friday/Saturday were good days, Sunday AM woke up and started spinning again. Denies falls. Has tried [employer], but that hasn't helped.  Pt accompanied by: self  PERTINENT HISTORY: migraine, diverticulitis, L eye retina detachment   PAIN:  Are you having pain? No  PRECAUTIONS: Fall   PATIENT  GOALS: "to not be dizzy"  VESTIBULAR ASSESSMENT:   POSITIONAL TESTING: Right Dix-Hallpike: no nystagmus Left Dix-Hallpike: upbeating, left nystagmus Right Roll Test: no nystagmus Left Roll Test: faint L torsional upbeating nystagmus   VESTIBULAR TREATMENT:                                                                                                    Canalith Repositioning:  Epley Left: Number of Reps: 3 and Response to Treatment: symptoms resolved   Francee Piccolo Daroff L x3 with mild reports of dizziness Brandt daroff R x3 with onset of mild L torsional upbeating nystagmus indicative of possible reoccurrence of L posterior canal canalithiasis?     PATIENT EDUCATION: Education details: hold on brandt daroff for now Person educated: Patient Education method: Chief Technology Officer Education comprehension: verbalized understanding and needs further education  HOME EXERCISE PROGRAM:  GOALS: Goals reviewed with patient? Yes  SHORT TERM GOALS: = LTG based on PT POC length   LONG TERM  GOALS: Target date: 10/10/23  Pt will be independent with final HEP for improved symptoms  Baseline: to be provided Goal status: INITIAL  2.  Patient will demonstrate (-) positional testing to indicate resolution of BPPV  Baseline: (+) L posterior canal canalithiasis  Goal status: INITIAL  3.  MSQ goal Baseline: to be completed Goal status: INITIAL  4.  FOTO goal Baseline: to be completed  Goal status: INITIAL    ASSESSMENT:  CLINICAL IMPRESSION: Patient seen for skilled PT session with emphasis on canalith repositioning. Again, demonstrated resolution of L posterior canal canalithiasis, but reported feeling as though she were on a boat up sitting up, not spinning. Upon completing brandt daroff- demonstrating L torsional upbeating nystagmus with R ear downward, which is not typical, but could indicate reoccurrence of L posterior canal canalithiasis. Instructed patient to hold on brandt  daroff for now until re-check Wednesday. Continue POC.   OBJECTIVE IMPAIRMENTS: dizziness.   ACTIVITY LIMITATIONS: sleeping, stairs, bed mobility, locomotion level, and caring for others  PARTICIPATION LIMITATIONS: interpersonal relationship, community activity, occupation, and yard work  PERSONAL FACTORS: Age, Past/current experiences, Sex, Time since onset of injury/illness/exacerbation, Transportation, and 1-2 comorbidities: see above  are also affecting patient's functional outcome.   REHAB POTENTIAL: Good  CLINICAL DECISION MAKING: Stable/uncomplicated  EVALUATION COMPLEXITY: Low   PLAN:  PT FREQUENCY: 2x/week 3x a week (re-cert)  PT DURATION: 4 weeks  PLANNED INTERVENTIONS: Therapeutic exercises, Therapeutic activity, Neuromuscular re-education, Balance training, Gait training, Patient/Family education, Self Care, Joint mobilization, Stair training, Vestibular training, Canalith repositioning, Visual/preceptual remediation/compensation, DME instructions, Aquatic Therapy, Manual therapy, and Re-evaluation  PLAN FOR NEXT SESSION: reassess L posterior canal, HEP (brandt daroff), MSQ, FOTO   Westley Foots, PT Westley Foots, PT, DPT, CBIS  09/15/2023, 3:31 PM

## 2023-09-17 ENCOUNTER — Ambulatory Visit: Payer: Self-pay

## 2023-09-19 ENCOUNTER — Ambulatory Visit: Payer: BC Managed Care – PPO | Admitting: Physical Therapy

## 2023-09-24 NOTE — Therapy (Signed)
[facility] [address] [place], [place], [address] Phone: [phone]   Fax:  [phone]  Patient Details  Name: Veronica Bailey MRN: [id number] Date of Birth: Aug 25, 1966 Referring Provider:  No ref. provider found  Encounter Date: 09/24/2023  PHYSICAL THERAPY DISCHARGE SUMMARY  Visits from Start of Care: 4  Current functional level related to goals / functional outcomes: Return to baseline   Remaining deficits: None reported, resolved BPPV   Education / Equipment: PT POC, HEP, BPPV pathophys   Patient agrees to discharge. Patient goals were met. Patient is being discharged due to being pleased with the current functional level.  Westley Foots, PT Westley Foots, PT, DPT, CBIS  09/24/2023, 9:06 AM  [facility] [address] [place], [place], [address] Phone: [phone]   Fax:  [phone]

## 2023-09-30 ENCOUNTER — Ambulatory Visit (INDEPENDENT_AMBULATORY_CARE_PROVIDER_SITE_OTHER): Payer: BC Managed Care – PPO | Admitting: Otolaryngology

## 2023-09-30 ENCOUNTER — Encounter (INDEPENDENT_AMBULATORY_CARE_PROVIDER_SITE_OTHER): Payer: Self-pay

## 2023-09-30 ENCOUNTER — Ambulatory Visit (INDEPENDENT_AMBULATORY_CARE_PROVIDER_SITE_OTHER): Payer: BC Managed Care – PPO | Admitting: Audiology

## 2023-09-30 VITALS — Ht 64.0 in | Wt 167.0 lb

## 2023-09-30 DIAGNOSIS — Z0110 Encounter for examination of ears and hearing without abnormal findings: Secondary | ICD-10-CM

## 2023-09-30 DIAGNOSIS — R42 Dizziness and giddiness: Secondary | ICD-10-CM

## 2023-09-30 DIAGNOSIS — H8110 Benign paroxysmal vertigo, unspecified ear: Secondary | ICD-10-CM

## 2023-09-30 NOTE — Progress Notes (Signed)
Dear Dr. Tiburcio Pea, Here is my assessment for our mutual patient, Veronica Bailey. Thank you for allowing me the opportunity to care for your patient. Please do not hesitate to contact me should you have any other questions. Sincerely, Dr. Jovita Kussmaul  Otolaryngology Clinic Note Referring provider: Dr. Tiburcio Pea HPI:  Veronica Bailey is a 57 y.o. female kindly referred by Dr. Tiburcio Pea for evaluation of vertigo. She reports that about 2 months ago, she was doing pilates and had an episode of frank vertigo and also had an almost syncopal episode. No antecedent event. Only lasted a minute. Felt really tired afterward. Once in a while she had reoccurrence, vertigo would reappear. Only lasted a day, and then did not have episodes of similar character this week. Closing eyes and trying to sleep would help. Turning head made it worse, and looking up made it worse. Focusing on one spot and that would help. No obvious ear pressure, pain, drainage, popping/cracakling. No hearing decline during this time.  No tinnitus, autophony.  Went to PCP, who gave her meclizine, and then went to vestibular rehab - then went away. Gone for about a week and a half, and then came back last Friday (woke up with it). Was not as bad as first go around. Saturday did some exercises, did not help (appears to be Epley maneuver). Went away. Completely asymptomatic currently  No barotrauma, no vestibular suppressant use currently, and no prior surgery in the past.  Vestibular rehab impression was consistent wit hL posterior canal canalithiasis with positive Dix-Hallpike  Does have migraines - when these episodes happen, does have some pressure. Day before the last episode, did have a migraine.   H&N Surgery: denies Personal or FHx of bleeding dz or anesth esia difficulty: no  Independent Review of Additional Tests or Records:  CT Head 2009: temporal bones clear, ME well aerated; unable to comprehensively assess [facility] 09/30/23 Audiogram was  independently reviewed and interpreted by me and it reveals AU: normal hearing thresholds b/l in all frequencies, SRT 10 dB; WRT 100% AU at 50 dB HL; type A tymps SNHL= Sensorineural hearing loss   PMH/Meds/All/SocHx/FamHx/ROS:   Past Medical History:  Diagnosis Date   Diverticulitis    Migraine      Past Surgical History:  Procedure Laterality Date   APPENDECTOMY     BACK SURGERY     CHOLECYSTECTOMY     LAPAROSCOPIC GASTRIC BANDING     LAPAROSCOPIC REVISION OF GASTRIC BAND      No family history on file.   Social Connections: Not on file     Current Outpatient Medications:    buPROPion (WELLBUTRIN XL) 300 MG 24 hr tablet, Take 300 mg by mouth daily., Disp: , Rfl:    ondansetron (ZOFRAN ODT) 4 MG disintegrating tablet, Take 1 tablet (4 mg total) by mouth every 8 (eight) hours as needed for nausea., Disp: 6 tablet, Rfl: 0   progesterone (PROMETRIUM) 100 MG capsule, Take 100 mg by mouth daily., Disp: , Rfl:    rizatriptan (MAXALT) 10 MG tablet, Take 10 mg by mouth daily as needed for migraine. May repeat in 2 hours if needed, Disp: , Rfl:    sertraline (ZOLOFT) 50 MG tablet, Take 1 tablet every day by oral route., Disp: , Rfl:    thyroid (ARMOUR) 30 MG tablet, Take 30 mg by mouth daily before breakfast., Disp: , Rfl:    zolpidem (AMBIEN) 10 MG tablet, Take 5 mg by mouth at bedtime., Disp: , Rfl:  azithromycin (ZITHROMAX) 250 MG tablet, Take 1 tablet (250 mg total) by mouth daily. Take first 2 tablets together, then 1 every day until finished., Disp: 6 tablet, Rfl: 0   ciprofloxacin (CIPRO) 500 MG tablet, Take 1 tablet (500 mg total) by mouth 2 (two) times daily. One po bid x 7 days, Disp: 14 tablet, Rfl: 0   clindamycin (CLEOCIN) 300 MG capsule, Take 1 capsule (300 mg total) by mouth every 6 (six) hours., Disp: 21 capsule, Rfl: 0   fexofenadine (ALLEGRA ALLERGY) 180 MG tablet, Take 1 tablet (180 mg total) by mouth daily for 15 days., Disp: 15 tablet, Rfl: 0   FIBER PO, Take 1  tablet by mouth at bedtime., Disp: , Rfl:    HYDROcodone-acetaminophen (NORCO/VICODIN) 5-325 MG tablet, Take 1-2 tablets by mouth every 6 (six) hours as needed., Disp: 15 tablet, Rfl: 0   levonorgestrel (MIRENA) 20 MCG/24HR IUD, 1 each by Intrauterine route once., Disp: , Rfl:    magnesium gluconate (MAGONATE) 500 MG tablet, Take 500 mg by mouth every evening., Disp: , Rfl:    metroNIDAZOLE (FLAGYL) 500 MG tablet, Take 1 tablet (500 mg total) by mouth 3 (three) times daily. One po tid x 7 days, Disp: 21 tablet, Rfl: 0   promethazine-dextromethorphan (PROMETHAZINE-DM) 6.25-15 MG/5ML syrup, Take 5 mLs by mouth 2 (two) times daily as needed for cough., Disp: 118 mL, Rfl: 0   Physical Exam:   Ht 5\' 4"  (1.626 m)   Wt 167 lb (75.8 kg)   LMP 01/19/2013   BMI 28.67 kg/m    Salient findings:  CN II-XII intact  Bilateral EAC clear and TM intact with well pneumatized middle ear spaces Weber 512: midline Rinne 512: AC > BC b/l  Rine 1024: AC > BC b/l  Head shake negative Head thrust negative Gait normal No nystagmus noted Anterior rhinoscopy: Septum relatively midline; no masses No lesions of oral cavity/oropharynx; dentition fair No obviously palpable neck masses/lymphadenopathy/thyromegaly No respiratory distress or stridor  Procedures:  None  Impression & Plans:  Veronica Bailey is a 57 y.o. female with: Vertigo - see above, appears to be consistent with vertigo rather than lightheadedness; based on duration, character, and symptoms, appears to be consistent with BPPV. She has normal hearing thresholds and 100% WRT. She is currently asymptomatic after vestibular rehab. Appears to be BPPV rather than meniere's or migraine. Do not think labyrinthitis or retrocochlear lesion given history.  - Discussed options, including formal vestibular testing. - As she is completely asymptomatic, she would like to observe for now which seems reasonable - She will call us in case of persistent symptoms or  reccurence; we discussed concerning symptoms as well   Thank you for allowing me the opportunity to care for your patient. Please do not hesitate to contact me should you have any other questions.  Sincerely, Jovita Kussmaul, MD Otolarynoglogist (ENT), [facility] Phone: [phone] Fax: [phone]  09/30/2023, 3:44 PM

## 2023-09-30 NOTE — Progress Notes (Signed)
  [address], [address] [place], [place] [address] [phone]  Audiological Evaluation    Name: Veronica Bailey     DOB:   13-Jun-1966      MRN:   [id number]                                                                                     Service Date: 09/30/2023       Patient was referred today for a hearing evaluation by Dr. Allena Katz.  Symptoms Yes Details  Hearing loss  []  Patient denied perceiving any hearing loss.  Tinnitus  []  Patient denied experiencing tinnitus.  Balance problems  [x]  Patient reported a spinning vertigo episode that began a month and a half ago after exercising.  Previous ear surgeries  []  Patient denied any previous ear surgeries.  Family history  []  Patient denied family history of hearing loss.  Amplification  []  Patient denied the use of hearing aids.    Otoscopy: Right ear: Clear external ear canal and notable landmarks visualized on the tympanic membrane. Left ear: Clear external ear canal and notable landmarks visualized on the tympanic membrane.    Tympanogram: Right ear: Normal external ear canal volume with normal middle ear pressure and tympanic membrane compliance (Type A). Left ear: Normal external ear canal volume with normal middle ear pressure and tympanic membrane compliance (Type A).    Hearing Evaluation: The audiogram was completed using conventional audiometric techniques under headphones with good reliability.   The hearing test results indicate: Right ear: Normal hearing sensitivity from [phone] Hz. Left ear: Normal hearing sensitivity from [phone] Hz.  Speech Audiometry: Right ear- Speech Reception Threshold (SRT) was obtained at 10 dBHL. Left ear- Speech Reception Threshold (SRT) was obtained at 10 dBHL.   Word Recognition Score Tested using NU-6 (MLV) Right ear: 100% was obtained at a presentation level of 50 dBHL which is deemed as excellent understanding. Left ear: 100% was obtained at a presentation level of 50  dBHL which is deemed as excellent understanding.   Impression: There is not a significant difference for puretone thresholds and word recognition scores between the ears.   Recommendations: Repeat audiogram when changes are perceived or per MD.   Conley Rolls Bryley Kovacevic, AUD, CCC-A 09/30/23

## 2023-10-24 ENCOUNTER — Ambulatory Visit (INDEPENDENT_AMBULATORY_CARE_PROVIDER_SITE_OTHER): Payer: BC Managed Care – PPO | Admitting: Audiology

## 2023-10-24 ENCOUNTER — Ambulatory Visit (INDEPENDENT_AMBULATORY_CARE_PROVIDER_SITE_OTHER): Payer: BC Managed Care – PPO

## 2023-10-24 ENCOUNTER — Telehealth (INDEPENDENT_AMBULATORY_CARE_PROVIDER_SITE_OTHER): Payer: Self-pay

## 2023-10-24 NOTE — Telephone Encounter (Signed)
Per patel patient was seen for same symptoms not even a month ago. DR. Allena Katz wants to do vestibular testing to rule out merniers disease. Then follow after the testing. Patient stated she also feels like she has a ear infection so Augmentin 875 is to be twice a day for 10 days. Then will schedule an appointment after vesibular testing.

## 2023-10-24 NOTE — Telephone Encounter (Signed)
Did verbal order per Dr. Allena Katz  for Augmentin 875 mg twice a day for 10 days. Spoke with pharmacist and prescription will be ready for patient to pick up.

## 2023-11-18 ENCOUNTER — Encounter (INDEPENDENT_AMBULATORY_CARE_PROVIDER_SITE_OTHER): Payer: BC Managed Care – PPO | Admitting: Audiology

## 2024-01-19 ENCOUNTER — Other Ambulatory Visit: Payer: Self-pay | Admitting: Sports Medicine
